# Patient Record
Sex: Male | Born: 1980 | Race: Black or African American | Hispanic: No | Smoking: Never smoker
Health system: Southern US, Community
[De-identification: ages and names within clinical notes are randomized; demographics above are authoritative.]

## PROBLEM LIST (undated history)

## (undated) DIAGNOSIS — K219 Gastro-esophageal reflux disease without esophagitis: Secondary | ICD-10-CM

## (undated) DIAGNOSIS — F419 Anxiety disorder, unspecified: Secondary | ICD-10-CM

## (undated) DIAGNOSIS — R42 Dizziness and giddiness: Secondary | ICD-10-CM

## (undated) DIAGNOSIS — T7840XA Allergy, unspecified, initial encounter: Secondary | ICD-10-CM

## (undated) HISTORY — DX: Gastro-esophageal reflux disease without esophagitis: K21.9

## (undated) HISTORY — DX: Anxiety disorder, unspecified: F41.9

## (undated) HISTORY — PX: TONSILLECTOMY: SUR1361

## (undated) HISTORY — PX: FRACTURE SURGERY: SHX138

## (undated) HISTORY — PX: EYE SURGERY: SHX253

## (undated) HISTORY — PX: NECK SURGERY: SHX720

## (undated) HISTORY — DX: Allergy, unspecified, initial encounter: T78.40XA

---

## 2009-05-10 ENCOUNTER — Ambulatory Visit (HOSPITAL_COMMUNITY): Admission: EM | Admit: 2009-05-10 | Discharge: 2009-05-10 | Payer: Self-pay | Admitting: Emergency Medicine

## 2009-08-27 ENCOUNTER — Emergency Department (HOSPITAL_COMMUNITY): Admission: EM | Admit: 2009-08-27 | Discharge: 2009-08-27 | Payer: Self-pay | Admitting: Emergency Medicine

## 2010-06-04 LAB — CBC
HCT: 46.3 % (ref 39.0–52.0)
MCHC: 34.3 g/dL (ref 30.0–36.0)
MCV: 84.5 fL (ref 78.0–100.0)
Platelets: 235 10*3/uL (ref 150–400)
RDW: 12.4 % (ref 11.5–15.5)

## 2010-06-04 LAB — SAMPLE TO BLOOD BANK

## 2010-06-04 LAB — DIFFERENTIAL
Basophils Relative: 1 % (ref 0–1)
Eosinophils Absolute: 0 10*3/uL (ref 0.0–0.7)
Eosinophils Relative: 1 % (ref 0–5)
Neutrophils Relative %: 57 % (ref 43–77)

## 2010-08-21 ENCOUNTER — Inpatient Hospital Stay (INDEPENDENT_AMBULATORY_CARE_PROVIDER_SITE_OTHER)
Admission: RE | Admit: 2010-08-21 | Discharge: 2010-08-21 | Disposition: A | Discharge status: Home / Self Care | Payer: BC Managed Care – PPO | Source: Ambulatory Visit | Attending: Family Medicine | Admitting: Family Medicine

## 2010-08-21 DIAGNOSIS — H81399 Other peripheral vertigo, unspecified ear: Secondary | ICD-10-CM

## 2011-02-15 ENCOUNTER — Encounter: Payer: Self-pay | Admitting: *Deleted

## 2011-02-15 ENCOUNTER — Emergency Department (INDEPENDENT_AMBULATORY_CARE_PROVIDER_SITE_OTHER)
Admission: EM | Admit: 2011-02-15 | Discharge: 2011-02-15 | Disposition: A | Discharge status: Home / Self Care | Payer: BC Managed Care – PPO | Source: Home / Self Care | Attending: Family Medicine | Admitting: Family Medicine

## 2011-02-15 DIAGNOSIS — R6889 Other general symptoms and signs: Secondary | ICD-10-CM

## 2011-02-15 MED ORDER — IBUPROFEN 800 MG PO TABS
800.0000 mg | ORAL_TABLET | Freq: Once | ORAL | Status: AC
  Administered 2011-02-15: 800 mg via ORAL

## 2011-02-15 MED ORDER — IBUPROFEN 800 MG PO TABS
ORAL_TABLET | ORAL | Status: AC
  Filled 2011-02-15: qty 1

## 2011-02-15 MED ORDER — FEXOFENADINE-PSEUDOEPHED ER 60-120 MG PO TB12: 1.0000 | ORAL_TABLET | Freq: Two times a day (BID) | ORAL | Status: DC

## 2011-02-15 MED ORDER — IBUPROFEN 800 MG PO TABS: 800.0000 mg | ORAL_TABLET | Freq: Three times a day (TID) | ORAL | Status: AC

## 2011-02-15 MED ORDER — HYDROCODONE-ACETAMINOPHEN 7.5-325 MG/15ML PO SOLN: 15.0000 mL | Freq: Three times a day (TID) | ORAL | Status: AC | PRN

## 2011-02-15 NOTE — ED Provider Notes (Signed)
History     CSN: [id number] Arrival date & time: 02/15/2011  3:49 PM   First MD Initiated Contact with Patient 02/15/11 1614      Chief Complaint  Patient presents with  . Cough  . Chills  . Generalized Body Aches    (Consider location/radiation/quality/duration/timing/severity/associated sxs/prior treatment) HPI Comments: Previusly healthy 30 y/o male here c/o generalized body aches, headache, cough with yellow sputum, nasal congestion for 2 days associated with hight fever up to 102. No nausea vomiting or diarrhea. No abdominal pain. No chest pain or difficulty breathing. Non smoker. Taking over the counter cold remedies inconsistently. Decreased appetite but drinking fluids well.  Patient is a 30 y.o. male presenting with cough.  Cough Associated symptoms include chills, rhinorrhea, sore throat and myalgias. Pertinent negatives include no chest pain, no ear pain, no headaches, no shortness of breath and no wheezing.    History reviewed. No pertinent past medical history.  History reviewed. No pertinent past surgical history.  History reviewed. No pertinent family history.  History  Substance Use Topics  . Smoking status: Never Smoker   . Smokeless tobacco: Not on file  . Alcohol Use: No      Review of Systems  Constitutional: Positive for fever, chills and appetite change.  HENT: Positive for congestion, sore throat and rhinorrhea. Negative for ear pain and trouble swallowing.   Eyes: Negative for discharge.  Respiratory: Positive for cough. Negative for chest tightness, shortness of breath and wheezing.   Cardiovascular: Negative for chest pain.  Gastrointestinal: Negative for abdominal pain.  Musculoskeletal: Positive for myalgias and arthralgias.  Skin: Negative for rash.  Neurological: Negative for headaches.    Allergies  Review of patient's allergies indicates no known allergies.  Home Medications   Current Outpatient Rx  Name Route Sig Dispense Refill   . FEXOFENADINE-PSEUDOEPHED ER 60-120 MG PO TB12 Oral Take 1 tablet by mouth every 12 (twelve) hours. 30 tablet 0  . HYDROCODONE-ACETAMINOPHEN 7.5-325 MG/15ML PO SOLN Oral Take 15 mLs by mouth every 8 (eight) hours as needed for pain (or cough). 120 mL 0  . IBUPROFEN 800 MG PO TABS Oral Take 1 tablet (800 mg total) by mouth 3 (three) times daily. 21 tablet 0    BP 126/75  Pulse 86  Temp(Src) 102.8 F (39.3 C) (Oral)  Resp 18  SpO2 100%  Physical Exam  Nursing note and vitals reviewed. Constitutional: He is oriented to person, place, and time. He appears well-developed and well-nourished. No distress.  HENT:  Head: Normocephalic.       Nasal Congestion with erythema and swelling of nasal turbinates, clear rhinorrhea. Significant pharyngeal erythema no exudates. No uvula deviation. No trismus. TM's with increased vascular markings and some dullness bilaterally no swelling or bulging   Eyes:       Bilateral conjunctival injection, no blepharitis or discharge.   Neck: Normal range of motion. Neck supple.  Cardiovascular: Normal rate, regular rhythm and normal heart sounds.   No murmur heard. Pulmonary/Chest: Effort normal and breath sounds normal. No respiratory distress. He has no wheezes. He has no rales. He exhibits no tenderness.  Abdominal: Soft. There is no tenderness.  Lymphadenopathy:    He has no cervical adenopathy.  Neurological: He is alert and oriented to person, place, and time.  Skin: Skin is warm. No rash noted.    ED Course  Procedures (including critical care time)  Labs Reviewed - No data to display No results found.   1. Influenza-like illness  MDM          Sharin Grave, MD 02/15/11 [phone]

## 2011-02-15 NOTE — ED Notes (Signed)
Pt is here with complaints of cough with yellow sputum, generalized body aches, chills and sore throat since Wednesday.

## 2011-02-16 NOTE — ED Notes (Signed)
[facility] called re: hydrocodone 7.5/325 prerscription only have 7.5/500  - per dr Ladon Applebaum may substitute  - pt with no history liver disease - verified with pt by pharmacy personnel

## 2012-02-14 ENCOUNTER — Encounter (HOSPITAL_COMMUNITY): Payer: Self-pay | Admitting: *Deleted

## 2012-02-14 ENCOUNTER — Emergency Department (HOSPITAL_COMMUNITY)
Admission: EM | Admit: 2012-02-14 | Discharge: 2012-02-14 | Discharge status: Against Medical Advice | Payer: BC Managed Care – PPO | Attending: Emergency Medicine | Admitting: Emergency Medicine

## 2012-02-14 ENCOUNTER — Emergency Department (HOSPITAL_COMMUNITY): Payer: BC Managed Care – PPO

## 2012-02-14 DIAGNOSIS — R079 Chest pain, unspecified: Secondary | ICD-10-CM | POA: Insufficient documentation

## 2012-02-14 HISTORY — DX: Dizziness and giddiness: R42

## 2012-02-14 LAB — BASIC METABOLIC PANEL
BUN: 16 mg/dL (ref 6–23)
Calcium: 10 mg/dL (ref 8.4–10.5)
GFR calc Af Amer: >90 mL/min (ref >90)
GFR calc non Af Amer: >90 mL/min (ref >90)
Glucose, Bld: 91 mg/dL (ref 70–99)
Potassium: 3.8 mEq/L (ref 3.5–5.1)
Sodium: 136 mEq/L (ref 135–145)

## 2012-02-14 LAB — CBC
Hemoglobin: 15.6 g/dL (ref 13.0–17.0)
MCH: 28.7 pg (ref 26.0–34.0)
MCHC: 34.5 g/dL (ref 30.0–36.0)
RDW: 12.7 % (ref 11.5–15.5)

## 2012-02-14 LAB — POCT I-STAT TROPONIN I

## 2012-02-14 NOTE — ED Notes (Signed)
Patient reports that he does not want to wait any longer to see a doctor; patient encouraged to stay and informed patient that all results are back and that the MD would be able to see results as soon as he gets a room.  Patient here with mother and father; patient states that he wants to follow up at Urgent Care in the morning rather than wait.  Asked patient if he would like to speak to charge nurse or AD; patient refused.  Patient stable at this time; no respiratory or acute distress.  Patient informed of risks of leaving AMA, including possible death.  Patient verbalized understanding.

## 2012-02-14 NOTE — ED Notes (Signed)
Pt states began experiencing left sided CP at work this morning, took 325 ASA at that time.  Also c/o left arm tingling.  Denis n/v, SOB, diaphoresis

## 2012-02-15 ENCOUNTER — Emergency Department (HOSPITAL_COMMUNITY)
Admission: EM | Admit: 2012-02-15 | Discharge: 2012-02-15 | Disposition: A | Discharge status: Home / Self Care | Payer: BC Managed Care – PPO | Attending: Emergency Medicine | Admitting: Emergency Medicine

## 2012-02-15 ENCOUNTER — Encounter (HOSPITAL_COMMUNITY): Payer: Self-pay | Admitting: *Deleted

## 2012-02-15 DIAGNOSIS — R0789 Other chest pain: Secondary | ICD-10-CM

## 2012-02-15 DIAGNOSIS — Z7982 Long term (current) use of aspirin: Secondary | ICD-10-CM | POA: Insufficient documentation

## 2012-02-15 LAB — BASIC METABOLIC PANEL
BUN: 13 mg/dL (ref 6–23)
CO2: 22 mEq/L (ref 19–32)
Calcium: 9.5 mg/dL (ref 8.4–10.5)
Creatinine, Ser: 1.01 mg/dL (ref 0.50–1.35)
GFR calc non Af Amer: >90 mL/min (ref >90)
Glucose, Bld: 87 mg/dL (ref 70–99)

## 2012-02-15 LAB — CBC
HCT: 46.1 % (ref 39.0–52.0)
MCHC: 34.5 g/dL (ref 30.0–36.0)
MCV: 82.8 fL (ref 78.0–100.0)
Platelets: 191 10*3/uL (ref 150–400)
RDW: 12.5 % (ref 11.5–15.5)
WBC: 4.1 10*3/uL (ref 4.0–10.5)

## 2012-02-15 NOTE — ED Provider Notes (Signed)
History     CSN: [id number]  Arrival date & time 02/15/12  1205   First MD Initiated Contact with Patient 02/15/12 1301      Chief Complaint  Patient presents with  . Chest Pain    (Consider location/radiation/quality/duration/timing/severity/associated sxs/prior treatment) Patient is a 31 y.o. male presenting with chest pain. The history is provided by the patient and a parent. No language interpreter was used.  Chest Pain The chest pain began more than 1 year ago. Chest pain occurs intermittently. The chest pain is worsening. At its most intense, the pain is at 8/10. The pain is currently at 0/10. The quality of the pain is described as sharp. The pain does not radiate. Pertinent negatives for primary symptoms include no fever, no syncope, no shortness of breath, no cough, no wheezing, no palpitations, no abdominal pain, no nausea, no vomiting and no dizziness. He tried nothing for the symptoms.  Procedure history is negative for cardiac catheterization, echocardiogram and stress echo.    31 year old male coming in with complaint of chest pain intermittently since high school. States that most recently the pain was yesterday around 9 AM is 8/10 sharp under his left breast it lasted several minutes. States that the pain returned over 12 times yesterday and last 10-12 minutes each time. Patient had no nausea shortness of breath and dizziness with the pain. Patient does not smoke he does not have high cholesterol he has no family history of high cholesterol or heart disease.does not smoke.   Patient came to the ER last night but after a six-hour wait decided to leave. He had one negative troponin last night. EKG is unchanged from last night.  States he has had no pain today. Patient exercises every day.  Past Medical History  Diagnosis Date  . Vertigo     Past Surgical History  Procedure Date  . Neck surgery   . Eye surgery     History reviewed. No pertinent family history.  History   Substance Use Topics  . Smoking status: Never Smoker   . Smokeless tobacco: Not on file  . Alcohol Use: No      Review of Systems  Constitutional: Negative.  Negative for fever.  HENT: Negative.   Eyes: Negative.   Respiratory: Negative.  Negative for cough, shortness of breath and wheezing.   Cardiovascular: Positive for chest pain. Negative for palpitations and syncope.  Gastrointestinal: Negative.  Negative for nausea, vomiting and abdominal pain.  Neurological: Negative.  Negative for dizziness.  Psychiatric/Behavioral: Negative.   All other systems reviewed and are negative.    Allergies  Review of patient's allergies indicates no known allergies.  Home Medications   Current Outpatient Rx  Name  Route  Sig  Dispense  Refill  . ASPIRIN 325 MG PO TABS   Oral   Take 325 mg by mouth every 4 (four) hours as needed. For pain         . ADULT MULTIVITAMIN W/MINERALS CH   Oral   Take 1 tablet by mouth daily.           BP 123/81  Pulse 52  Temp 97.9 F (36.6 C) (Oral)  Resp 18  Ht 5\' 10"  (1.778 m)  Wt 176 lb (79.833 kg)  BMI 25.25 kg/m2  SpO2 100%  Physical Exam  Nursing note and vitals reviewed. Constitutional: He is oriented to person, place, and time. He appears well-developed and well-nourished.  HENT:  Head: Normocephalic.  Eyes: Conjunctivae normal and EOM  are normal. Pupils are equal, round, and reactive to light.  Neck: Normal range of motion. Neck supple.  Cardiovascular: Normal rate, regular rhythm, normal heart sounds and intact distal pulses.   Pulmonary/Chest: Effort normal and breath sounds normal. No respiratory distress.  Abdominal: Soft. Bowel sounds are normal. He exhibits no distension.  Musculoskeletal: Normal range of motion.  Neurological: He is alert and oriented to person, place, and time.  Skin: Skin is warm and dry.  Psychiatric: He has a normal mood and affect.    ED Course  Procedures (including critical care  time)   Labs Reviewed  CBC  POCT I-STAT TROPONIN I  BASIC METABOLIC PANEL  POCT I-STAT TROPONIN I   Dg Chest 2 View  02/14/2012  *RADIOLOGY REPORT*  Clinical Data: Chest pain.  Night sweats.  CHEST - 2 VIEW  Comparison: None.  Findings: Normal sized heart.  Clear lungs.  Minimal scoliosis.  IMPRESSION: No acute abnormality.   Original Report Authenticated By: Beckie Salts, M.D.      1. Chest pain, atypical       MDM  31 yo male with no cardiac risk factors here with c/o L chest pain under L breast/ sharp since high school but more frequent yesterday.  Trop - x 3 .  Ekg unremarkable.  Chest x-ray normal reviewed by myself.  Labs unremarkable.  Will follow up with cardiology this week.  Understands to return to ER for worsening symptoms.   Date: 02/16/2012  Rate: 67  Rhythm: normal sinus rhythm  QRS Axis: normal  Intervals: normal  ST/T Wave abnormalities: early repolarization  Conduction Disutrbances:none  Narrative Interpretation:   Old EKG Reviewed: unchanged  Labs Reviewed  CBC  POCT I-STAT TROPONIN I  BASIC METABOLIC PANEL  POCT I-STAT TROPONIN I  LAB REPORT - SCANNED            Remi Haggard, NP 02/16/12 1134

## 2012-02-15 NOTE — ED Notes (Signed)
Reports having left side chest pain, describes as aching pain, mild ache to left forearm as well. Pt was here last night but left ama due to long wait. ekg done at triage. No resp distress noted at this time.

## 2012-02-18 NOTE — ED Provider Notes (Signed)
Medical screening examination/treatment/procedure(s) were performed by non-physician practitioner and as supervising physician I was immediately available for consultation/collaboration.   Carleene Cooper III, MD 02/18/12 2127

## 2012-02-29 ENCOUNTER — Emergency Department (INDEPENDENT_AMBULATORY_CARE_PROVIDER_SITE_OTHER)
Admission: EM | Admit: 2012-02-29 | Discharge: 2012-02-29 | Disposition: A | Discharge status: Other Acute Inpatient Hospital | Payer: BC Managed Care – PPO | Source: Home / Self Care | Attending: Family Medicine | Admitting: Family Medicine

## 2012-02-29 ENCOUNTER — Emergency Department (HOSPITAL_COMMUNITY)
Admission: EM | Admit: 2012-02-29 | Discharge: 2012-02-29 | Discharge status: Against Medical Advice | Payer: BC Managed Care – PPO | Attending: Emergency Medicine | Admitting: Emergency Medicine

## 2012-02-29 ENCOUNTER — Encounter (HOSPITAL_COMMUNITY): Payer: Self-pay

## 2012-02-29 ENCOUNTER — Encounter (HOSPITAL_COMMUNITY): Payer: Self-pay | Admitting: Family Medicine

## 2012-02-29 ENCOUNTER — Emergency Department (HOSPITAL_COMMUNITY): Payer: BC Managed Care – PPO

## 2012-02-29 DIAGNOSIS — R0789 Other chest pain: Secondary | ICD-10-CM | POA: Insufficient documentation

## 2012-02-29 DIAGNOSIS — R0602 Shortness of breath: Secondary | ICD-10-CM | POA: Insufficient documentation

## 2012-02-29 LAB — COMPREHENSIVE METABOLIC PANEL
AST: 25 U/L (ref 0–37)
BUN: 12 mg/dL (ref 6–23)
CO2: 24 mEq/L (ref 19–32)
Calcium: 10.2 mg/dL (ref 8.4–10.5)
Creatinine, Ser: 1.13 mg/dL (ref 0.50–1.35)
GFR calc Af Amer: >90 mL/min (ref >90)
GFR calc non Af Amer: 84 mL/min — ABNORMAL LOW (ref >90)

## 2012-02-29 LAB — CBC WITH DIFFERENTIAL/PLATELET
Basophils Absolute: 0.1 10*3/uL (ref 0.0–0.1)
Basophils Relative: 2 % — ABNORMAL HIGH (ref 0–1)
Eosinophils Relative: 1 % (ref 0–5)
HCT: 47.2 % (ref 39.0–52.0)
Lymphocytes Relative: 55 % — ABNORMAL HIGH (ref 12–46)
MCHC: 34.7 g/dL (ref 30.0–36.0)
MCV: 83.4 fL (ref 78.0–100.0)
Monocytes Absolute: 0.3 10*3/uL (ref 0.1–1.0)
RDW: 12.9 % (ref 11.5–15.5)

## 2012-02-29 LAB — POCT I-STAT TROPONIN I: Troponin i, poc: 0 ng/mL (ref 0.00–0.08)

## 2012-02-29 LAB — D-DIMER, QUANTITATIVE: D-Dimer, Quant: <0.27 ug/mL-FEU (ref 0.00–0.48)

## 2012-02-29 NOTE — ED Notes (Signed)
reports left sided cp for past few days; saw NP in ED, and was released; states pain about same, but having trouble feeling as if he could not get a good breath this AM

## 2012-02-29 NOTE — ED Notes (Signed)
Pt sent here from [facility] to R/O PE. Pt chest pressure and SOB that started this am. sts feels better now than this am.

## 2012-02-29 NOTE — ED Notes (Signed)
Pt states that he can't wait any longer and needs to leave.

## 2012-02-29 NOTE — ED Provider Notes (Signed)
History     CSN: [id number]  Arrival date & time 02/29/12  1140   First MD Initiated Contact with Patient 02/29/12 1200      Chief Complaint  Patient presents with  . Chest Pain    (Consider location/radiation/quality/duration/timing/severity/associated sxs/prior treatment) Patient is a 31 y.o. male presenting with chest pain. The history is provided by the patient.  Chest Pain The chest pain began more than 1 year ago (seen recently in ER with full w/u , sx since high school   sx of tightness and sob are different from prev, have totally resolved at present.). The chest pain is improving. The pain is associated with breathing. The pain is currently at 0/10. The severity of the pain is mild. The quality of the pain is described as tightness. Pertinent negatives for primary symptoms include no cough, no wheezing, no palpitations, no abdominal pain, no nausea and no vomiting.  Pertinent negatives for associated symptoms include no lower extremity edema.     Past Medical History  Diagnosis Date  . Vertigo     Past Surgical History  Procedure Date  . Neck surgery   . Eye surgery     History reviewed. No pertinent family history.  History  Substance Use Topics  . Smoking status: Never Smoker   . Smokeless tobacco: Not on file  . Alcohol Use: No      Review of Systems  Constitutional: Negative.   Respiratory: Positive for chest tightness. Negative for cough and wheezing.   Cardiovascular: Positive for chest pain. Negative for palpitations.  Gastrointestinal: Negative.  Negative for nausea, vomiting and abdominal pain.    Allergies  Review of patient's allergies indicates no known allergies.  Home Medications   Current Outpatient Rx  Name  Route  Sig  Dispense  Refill  . ASPIRIN 325 MG PO TABS   Oral   Take 325 mg by mouth every 4 (four) hours as needed. For pain         . ADULT MULTIVITAMIN W/MINERALS CH   Oral   Take 1 tablet by mouth daily.            BP 137/88  Pulse 70  Temp 99.1 F (37.3 C) (Oral)  Resp 18  SpO2 100%  Physical Exam  Nursing note and vitals reviewed. Constitutional: He is oriented to person, place, and time. He appears well-developed and well-nourished.  HENT:  Head: Normocephalic.  Mouth/Throat: Oropharynx is clear and moist.  Eyes: Pupils are equal, round, and reactive to light.  Neck: Normal range of motion. Neck supple.  Pulmonary/Chest: Effort normal and breath sounds normal.  Abdominal: Soft. Bowel sounds are normal.  Neurological: He is alert and oriented to person, place, and time.  Skin: Skin is warm and dry.    ED Course  Procedures (including critical care time)  Labs Reviewed - No data to display No results found.   1. Chest pain, atypical       MDM  ecg--early repol otherwise unchanged from prev,         Linna Hoff, MD 02/29/12 1239

## 2012-12-19 ENCOUNTER — Ambulatory Visit (INDEPENDENT_AMBULATORY_CARE_PROVIDER_SITE_OTHER): Payer: BC Managed Care – PPO | Admitting: Family Medicine

## 2012-12-19 VITALS — BP 120/78 | HR 48 | Temp 98.8°F | Resp 18 | Ht 69.5 in | Wt 182.6 lb

## 2012-12-19 DIAGNOSIS — D171 Benign lipomatous neoplasm of skin and subcutaneous tissue of trunk: Secondary | ICD-10-CM

## 2012-12-19 DIAGNOSIS — R109 Unspecified abdominal pain: Secondary | ICD-10-CM

## 2012-12-19 DIAGNOSIS — K5900 Constipation, unspecified: Secondary | ICD-10-CM

## 2012-12-19 NOTE — Patient Instructions (Addendum)
Your symptoms appear to be due to constipation.  Can start fleet enema once or twice today, then miralax 1 cap twice per day until soft stool, then once per day.  If diarrhea or watery stool, stop Miralax, but increase fluids and fiber in diet. See below. If any fever, nausea, vomiting, change in stool color, increase in abdominal pain, or not improving in the next few days - return here or emergency room if needed for recheck.  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    Constipation, Adult Constipation is when a person has fewer than 3 bowel movements a week; has difficulty having a bowel movement; or has stools that are dry, hard, or larger than normal. As people grow older, constipation is more common. If you try to fix constipation with medicines that make you have a bowel movement (laxatives), the problem may get worse. Long-term laxative use may cause the muscles of the colon to become weak. A low-fiber diet, not taking in enough fluids, and taking certain medicines may make constipation worse. CAUSES   Certain medicines, such as antidepressants, pain medicine, iron supplements, antacids, and water pills.   Certain diseases, such as diabetes, irritable bowel syndrome (IBS), thyroid disease, or depression.   Not drinking enough water.   Not eating enough fiber-rich foods.   Stress or travel.  Lack of physical activity or exercise.  Not going to the restroom when there is the urge to have a bowel movement.  Ignoring the urge to have a bowel movement.  Using laxatives too much. SYMPTOMS   Having fewer than 3 bowel movements a week.   Straining to have a bowel movement.   Having hard, dry, or larger than normal stools.   Feeling full or bloated.   Pain in the lower abdomen.  Not feeling relief after having a bowel movement. DIAGNOSIS  Your caregiver will take a medical history and perform  a physical exam. Further testing may be done for severe constipation. Some tests may include:   A barium enema X-ray to examine your rectum, colon, and sometimes, your small intestine.  A sigmoidoscopy to examine your lower colon.  A colonoscopy to examine your entire colon. TREATMENT  Treatment will depend on the severity of your constipation and what is causing it. Some dietary treatments include drinking more fluids and eating more fiber-rich foods. Lifestyle treatments may include regular exercise. If these diet and lifestyle recommendations do not help, your caregiver may recommend taking over-the-counter laxative medicines to help you have bowel movements. Prescription medicines may be prescribed if over-the-counter medicines do not work.  HOME CARE INSTRUCTIONS   Increase dietary fiber in your diet, such as fruits, vegetables, whole grains, and beans. Limit high-fat and processed sugars in your diet, such as [place] fries, hamburgers, cookies, candies, and soda.   A fiber supplement may be added to your diet if you cannot get enough fiber from foods.   Drink enough fluids to keep your urine clear or pale yellow.   Exercise regularly or as directed by your caregiver.   Go to the restroom when you have the urge to go. Do not hold it.  Only take medicines as directed by your caregiver. Do not take other medicines for constipation without talking to your caregiver first. SEEK IMMEDIATE MEDICAL CARE IF:   You have bright red blood in your stool.   Your constipation lasts for  more than 4 days or gets worse.   You have abdominal or rectal pain.   You have thin, pencil-like stools.  You have unexplained weight loss. MAKE SURE YOU:   Understand these instructions.  Will watch your condition.  Will get help right away if you are not doing well or get worse. Document Released: 11/24/2003 Document Revised: 05/20/2011 Document Reviewed: 01/29/2011 [facility] Patient  Information 2014 [place], [place].   Lipoma A lipoma is a noncancerous (benign) tumor composed of fat cells. They are usually found under the skin (subcutaneous). A lipoma may occur in any tissue of the body that contains fat. Common areas for lipomas to appear include the back, shoulders, buttocks, and thighs. Lipomas are a very common soft tissue growth. They are soft and grow slowly. Most problems caused by a lipoma depend on where it is growing. DIAGNOSIS  A lipoma can be diagnosed with a physical exam. These tumors rarely become cancerous, but radiographic studies can help determine this for certain. Studies used may include:  Computerized X-ray scans (CT or CAT scan).  Computerized magnetic scans (MRI). TREATMENT  Small lipomas that are not causing problems may be watched. If a lipoma continues to enlarge or causes problems, removal is often the best treatment. Lipomas can also be removed to improve appearance. Surgery is done to remove the fatty cells and the surrounding capsule. Most often, this is done with medicine that numbs the area (local anesthetic). The removed tissue is examined under a microscope to make sure it is not cancerous. Keep all follow-up appointments with your caregiver. SEEK MEDICAL CARE IF:   The lipoma becomes larger or hard.  The lipoma becomes painful, red, or increasingly swollen. These could be signs of infection or a more serious condition. Document Released: 02/15/2002 Document Revised: 05/20/2011 Document Reviewed: 07/28/2009 [facility] Patient Information 2014 [place], [place].

## 2012-12-19 NOTE — Progress Notes (Signed)
Subjective:    Patient ID: Jeremy [name], male    DOB: 1980/07/29, 32 y.o.   MRN: [id number]  HPI Jeremy [name] is a 32 y.o. male  L sided flank pain/abd pain for few weeks.    Seen by Dr. Roseanne Reno for similar sx's earlier this year - constipated then - given Rx for stool softener - lactulose? Helped some.  Has been constipated recently again. Last BM 3 days ago, hard stool.  Constipated past 2-3 weeks as well, tried lactulose 2 tablespoons every other day - restarted last week, no relief. Last dose of lactulose 2 days ago. BM usually 1-2 times per day when well.   Pain on L side after eating, no fever/n/v/diarrhea.   No hx of kidney stones. No known hx of PUD, no dark/tarry stools.  Etoh: 4 drinks per month, no recent increase.   Review of Systems  Constitutional: Negative for fever and chills.  Respiratory: Negative for shortness of breath.   Cardiovascular: Negative for chest pain.  Gastrointestinal: Positive for abdominal pain. Negative for abdominal distention (bloated at times. ).  Genitourinary: Negative for dysuria, urgency, frequency, hematuria and difficulty urinating.      Objective:   Physical Exam  Vitals reviewed. Constitutional: He is oriented to person, place, and time. He appears well-developed and well-nourished.  HENT:  Head: Normocephalic and atraumatic.  Eyes: EOM are normal. Pupils are equal, round, and reactive to light.  Neck: No JVD present. Carotid bruit is not present.  Cardiovascular: Normal rate, regular rhythm and normal heart sounds.   No murmur heard. Pulmonary/Chest: Effort normal and breath sounds normal. He has no rales.  Abdominal: Soft. Bowel sounds are normal. He exhibits no distension. There is no tenderness. There is no rebound and no guarding.  Musculoskeletal: He exhibits no edema.  Neurological: He is alert and oriented to person, place, and time.  Skin: Skin is warm and dry.     Psychiatric: He has a normal mood and affect.        Assessment & Plan:  Jeremy [name] is a 32 y.o. male Constipation  Abdominal pain  Lipoma of back   2-3 week hx of abdominal pain L sided.  Suspected constipation. Discussed option of treating this first and recheck in next few days or labs and xrays today. Will try fleet enema, and miralax with recheck if not improving next few days.  Sooner if worse. Constipation prevention discussed.    Lipoma - discussed benign appearance, but RTC precautions.    Patient Instructions  Your symptoms appear to be due to constipation.  Can start fleet enema once or twice today, then miralax 1 cap twice per day until soft stool, then once per day.  If diarrhea or watery stool, stop Miralax, but increase fluids and fiber in diet. See below. If any fever, nausea, vomiting, change in stool color, increase in abdominal pain, or not improving in the next few days - return here or emergency room if needed for recheck.  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    Constipation, Adult Constipation is when a person has fewer than 3 bowel movements a week; has difficulty having a bowel movement; or has stools that are dry, hard, or larger than normal. As people grow older, constipation is more common. If you try to fix constipation with medicines that make you have a bowel movement (laxatives), the problem may get  worse. Long-term laxative use may cause the muscles of the colon to become weak. A low-fiber diet, not taking in enough fluids, and taking certain medicines may make constipation worse. CAUSES   Certain medicines, such as antidepressants, pain medicine, iron supplements, antacids, and water pills.   Certain diseases, such as diabetes, irritable bowel syndrome (IBS), thyroid disease, or depression.   Not drinking enough water.   Not eating enough fiber-rich foods.   Stress or travel.  Lack of physical activity or  exercise.  Not going to the restroom when there is the urge to have a bowel movement.  Ignoring the urge to have a bowel movement.  Using laxatives too much. SYMPTOMS   Having fewer than 3 bowel movements a week.   Straining to have a bowel movement.   Having hard, dry, or larger than normal stools.   Feeling full or bloated.   Pain in the lower abdomen.  Not feeling relief after having a bowel movement. DIAGNOSIS  Your caregiver will take a medical history and perform a physical exam. Further testing may be done for severe constipation. Some tests may include:   A barium enema X-ray to examine your rectum, colon, and sometimes, your small intestine.  A sigmoidoscopy to examine your lower colon.  A colonoscopy to examine your entire colon. TREATMENT  Treatment will depend on the severity of your constipation and what is causing it. Some dietary treatments include drinking more fluids and eating more fiber-rich foods. Lifestyle treatments may include regular exercise. If these diet and lifestyle recommendations do not help, your caregiver may recommend taking over-the-counter laxative medicines to help you have bowel movements. Prescription medicines may be prescribed if over-the-counter medicines do not work.  HOME CARE INSTRUCTIONS   Increase dietary fiber in your diet, such as fruits, vegetables, whole grains, and beans. Limit high-fat and processed sugars in your diet, such as [place] fries, hamburgers, cookies, candies, and soda.   A fiber supplement may be added to your diet if you cannot get enough fiber from foods.   Drink enough fluids to keep your urine clear or pale yellow.   Exercise regularly or as directed by your caregiver.   Go to the restroom when you have the urge to go. Do not hold it.  Only take medicines as directed by your caregiver. Do not take other medicines for constipation without talking to your caregiver first. SEEK IMMEDIATE MEDICAL CARE  IF:   You have bright red blood in your stool.   Your constipation lasts for more than 4 days or gets worse.   You have abdominal or rectal pain.   You have thin, pencil-like stools.  You have unexplained weight loss. MAKE SURE YOU:   Understand these instructions.  Will watch your condition.  Will get help right away if you are not doing well or get worse. Document Released: 11/24/2003 Document Revised: 05/20/2011 Document Reviewed: 01/29/2011 [facility] Patient Information 2014 [place], [place].   Lipoma A lipoma is a noncancerous (benign) tumor composed of fat cells. They are usually found under the skin (subcutaneous). A lipoma may occur in any tissue of the body that contains fat. Common areas for lipomas to appear include the back, shoulders, buttocks, and thighs. Lipomas are a very common soft tissue growth. They are soft and grow slowly. Most problems caused by a lipoma depend on where it is growing. DIAGNOSIS  A lipoma can be diagnosed with a physical exam. These tumors rarely become cancerous, but radiographic studies can help  determine this for certain. Studies used may include:  Computerized X-ray scans (CT or CAT scan).  Computerized magnetic scans (MRI). TREATMENT  Small lipomas that are not causing problems may be watched. If a lipoma continues to enlarge or causes problems, removal is often the best treatment. Lipomas can also be removed to improve appearance. Surgery is done to remove the fatty cells and the surrounding capsule. Most often, this is done with medicine that numbs the area (local anesthetic). The removed tissue is examined under a microscope to make sure it is not cancerous. Keep all follow-up appointments with your caregiver. SEEK MEDICAL CARE IF:   The lipoma becomes larger or hard.  The lipoma becomes painful, red, or increasingly swollen. These could be signs of infection or a more serious condition. Document Released: 02/15/2002 Document  Revised: 05/20/2011 Document Reviewed: 07/28/2009 [facility] Patient Information 2014 [place], [place].

## 2013-06-01 ENCOUNTER — Encounter: Payer: Self-pay | Admitting: Gastroenterology

## 2013-07-08 ENCOUNTER — Ambulatory Visit (INDEPENDENT_AMBULATORY_CARE_PROVIDER_SITE_OTHER): Payer: BC Managed Care – PPO | Admitting: Gastroenterology

## 2013-07-08 ENCOUNTER — Encounter: Payer: Self-pay | Admitting: Gastroenterology

## 2013-07-08 VITALS — BP 124/68 | HR 68 | Ht 69.0 in | Wt 185.0 lb

## 2013-07-08 DIAGNOSIS — K5900 Constipation, unspecified: Secondary | ICD-10-CM

## 2013-07-08 DIAGNOSIS — K219 Gastro-esophageal reflux disease without esophagitis: Secondary | ICD-10-CM

## 2013-07-08 MED ORDER — OMEPRAZOLE 20 MG PO CPDR: 20.0000 mg | DELAYED_RELEASE_CAPSULE | Freq: Every day | ORAL | Status: DC

## 2013-07-08 NOTE — Progress Notes (Signed)
    History of Present Illness: This is a 33 year old male who complains of constipation, abdominal pain and frequent belching. He was seen by his PCP and at [facility]. He substantially changed his diet avoiding high fat and fried foods and eating more fruits and vegetables. He drinks a substantial amount of water. His constipation and abdominal pain completely resolved. He has had improvement in his frequent belching but he still notes this problem on a daily basis. Denies weight loss, diarrhea, change in stool caliber, melena, hematochezia, nausea, vomiting, dysphagia, chest pain.  Review of Systems: Pertinent positive and negative review of systems were noted in the above HPI section. All other review of systems were otherwise negative.  Current Medications, Allergies, Past Medical History, Past Surgical History, Family History and Social History were reviewed in [employer] record.  Physical Exam: General: Well developed , well nourished, no acute distress Head: Normocephalic and atraumatic Eyes:  sclerae anicteric, EOMI Ears: Normal auditory acuity Mouth: No deformity or lesions Neck: Supple, no masses or thyromegaly Lungs: Clear throughout to auscultation Heart: Regular rate and rhythm; no murmurs, rubs or bruits Abdomen: Soft, non tender and non distended. No masses, hepatosplenomegaly or hernias noted. Normal Bowel sounds Musculoskeletal: Symmetrical with no gross deformities  Skin: No lesions on visible extremities Pulses:  Normal pulses noted Extremities: No clubbing, cyanosis, edema or deformities noted Neurological: Alert oriented x 4, grossly nonfocal Cervical Nodes:  No significant cervical adenopathy Inguinal Nodes: No significant inguinal adenopathy Psychological:  Alert and cooperative. Normal mood and affect  Assessment and Recommendations:  1. Constipation and abdominal pain. Continue high fiber healthy diet adequate daily water intake. REV  in 6 weeks.  2. Frequent belching. Presumed GERD. Begin standard antireflux measures and omeprazole 20 mg daily. REV in 6 weeks.

## 2013-07-08 NOTE — Patient Instructions (Signed)
We have sent the following medications to your pharmacy for you to pick up at your convenience: Omeprazole.  Patient advised to avoid spicy, acidic, citrus, chocolate, mints, fruit and fruit juices.  Limit the intake of caffeine, alcohol and Soda.  Don't exercise too soon after eating.  Don't lie down within 3-4 hours of eating.  Elevate the head of your bed.  Thank you for choosing me and [facility].  Pricilla Riffle. Dagoberto Ligas., MD., Marval Regal  cc: Ledell Peoples, MD

## 2013-07-26 ENCOUNTER — Telehealth: Payer: Self-pay | Admitting: Gastroenterology

## 2013-07-26 NOTE — Telephone Encounter (Signed)
Patient requesting an appt with Dr. Fuller Plan.  He is having side effects from omeprazole.  He stopped it 2 days ago and symptoms resolved. He is asked to try omeprazole again and if his symptoms return then call us back and we will change his PPI.  He is given a follow up with DR. Fuller Plan for 6/4

## 2013-08-12 ENCOUNTER — Ambulatory Visit (INDEPENDENT_AMBULATORY_CARE_PROVIDER_SITE_OTHER): Payer: BC Managed Care – PPO | Admitting: Gastroenterology

## 2013-08-12 ENCOUNTER — Encounter: Payer: Self-pay | Admitting: Gastroenterology

## 2013-08-12 ENCOUNTER — Other Ambulatory Visit (INDEPENDENT_AMBULATORY_CARE_PROVIDER_SITE_OTHER): Payer: BC Managed Care – PPO

## 2013-08-12 VITALS — BP 110/60 | HR 60 | Ht 69.0 in | Wt 183.0 lb

## 2013-08-12 DIAGNOSIS — R1013 Epigastric pain: Secondary | ICD-10-CM

## 2013-08-12 DIAGNOSIS — R109 Unspecified abdominal pain: Secondary | ICD-10-CM

## 2013-08-12 DIAGNOSIS — R143 Flatulence: Secondary | ICD-10-CM

## 2013-08-12 DIAGNOSIS — K5900 Constipation, unspecified: Secondary | ICD-10-CM

## 2013-08-12 DIAGNOSIS — R141 Gas pain: Secondary | ICD-10-CM

## 2013-08-12 DIAGNOSIS — K3189 Other diseases of stomach and duodenum: Secondary | ICD-10-CM

## 2013-08-12 DIAGNOSIS — R142 Eructation: Secondary | ICD-10-CM

## 2013-08-12 LAB — CBC WITH DIFFERENTIAL/PLATELET
BASOS ABS: 0 10*3/uL (ref 0.0–0.1)
Basophils Relative: 1 % (ref 0.0–3.0)
Eosinophils Absolute: 0.1 10*3/uL (ref 0.0–0.7)
Eosinophils Relative: 3 % (ref 0.0–5.0)
HCT: 41.9 % (ref 39.0–52.0)
Hemoglobin: 14.1 g/dL (ref 13.0–17.0)
LYMPHS ABS: 2.2 10*3/uL (ref 0.7–4.0)
LYMPHS PCT: 47.1 % — AB (ref 12.0–46.0)
MCHC: 33.7 g/dL (ref 30.0–36.0)
MCV: 85.7 fl (ref 78.0–100.0)
MONOS PCT: 8.1 % (ref 3.0–12.0)
Monocytes Absolute: 0.4 10*3/uL (ref 0.1–1.0)
NEUTROS PCT: 40.8 % — AB (ref 43.0–77.0)
Neutro Abs: 1.9 10*3/uL (ref 1.4–7.7)
PLATELETS: 206 10*3/uL (ref 150.0–400.0)
RBC: 4.89 Mil/uL (ref 4.22–5.81)
RDW: 13.3 % (ref 11.5–15.5)
WBC: 4.7 10*3/uL (ref 4.0–10.5)

## 2013-08-12 LAB — BASIC METABOLIC PANEL
BUN: 8 mg/dL (ref 6–23)
CO2: 29 meq/L (ref 19–32)
Calcium: 9.3 mg/dL (ref 8.4–10.5)
Chloride: 105 mEq/L (ref 96–112)
Creatinine, Ser: 1 mg/dL (ref 0.4–1.5)
GFR: 104.83 mL/min (ref >60.00)
GLUCOSE: 80 mg/dL (ref 70–99)
POTASSIUM: 3.5 meq/L (ref 3.5–5.1)
SODIUM: 139 meq/L (ref 135–145)

## 2013-08-12 LAB — HEPATIC FUNCTION PANEL
ALBUMIN: 4.3 g/dL (ref 3.5–5.2)
ALK PHOS: 54 U/L (ref 39–117)
ALT: 32 U/L (ref 0–53)
AST: 34 U/L (ref 0–37)
Bilirubin, Direct: 0 mg/dL (ref 0.0–0.3)
TOTAL PROTEIN: 6.8 g/dL (ref 6.0–8.3)
Total Bilirubin: 0.2 mg/dL (ref 0.2–1.2)

## 2013-08-12 LAB — TSH: TSH: 0.6 u[IU]/mL (ref 0.35–4.50)

## 2013-08-12 MED ORDER — PANTOPRAZOLE SODIUM 40 MG PO TBEC: 40.0000 mg | DELAYED_RELEASE_TABLET | Freq: Every day | ORAL | Status: DC

## 2013-08-12 NOTE — Patient Instructions (Addendum)
Your physician has requested that you go to the basement for the following lab work before leaving today: [employer]  Stop taking your Prilosec.  We have sent the following medications to your pharmacy for you to pick up at your convenience: pantoprazole.   Start over the counter Gas-X four times a day as needed for gas and bloating.  Increase your fluid intake to help with constipation.  High-Fiber Diet Fiber is found in fruits, vegetables, and grains. A high-fiber diet encourages the addition of more whole grains, legumes, fruits, and vegetables in your diet. The recommended amount of fiber for adult males is 38 g per day. For adult females, it is 25 g per day. Pregnant and lactating women should get 28 g of fiber per day. If you have a digestive or bowel problem, ask your caregiver for advice before adding high-fiber foods to your diet. Eat a variety of high-fiber foods instead of only a select few type of foods.  PURPOSE  To increase stool bulk.  To make bowel movements more regular to prevent constipation.  To lower cholesterol.  To prevent overeating. WHEN IS THIS DIET USED?  It may be used if you have constipation and hemorrhoids.  It may be used if you have uncomplicated diverticulosis (intestine condition) and irritable bowel syndrome.  It may be used if you need help with weight management.  It may be used if you want to add it to your diet as a protective measure against atherosclerosis, diabetes, and cancer. SOURCES OF FIBER  Whole-grain breads and cereals.  Fruits, such as apples, oranges, bananas, berries, prunes, and pears.  Vegetables, such as green peas, carrots, sweet potatoes, beets, broccoli, cabbage, spinach, and artichokes.  Legumes, such split peas, soy, lentils.  Almonds. FIBER CONTENT IN FOODS Starches and Grains / Dietary Fiber (g)  Cheerios, 1 cup / 3 g  Corn Flakes cereal, 1 cup / 0.7 g  Rice crispy treat cereal, 1 cup / 0.3  g  Instant oatmeal (cooked),  cup / 2 g  Frosted wheat cereal, 1 cup / 5.1 g  Brown, long-grain rice (cooked), 1 cup / 3.5 g  White, long-grain rice (cooked), 1 cup / 0.6 g  Enriched macaroni (cooked), 1 cup / 2.5 g Legumes / Dietary Fiber (g)  Baked beans (canned, plain, or vegetarian),  cup / 5.2 g  Kidney beans (canned),  cup / 6.8 g  Pinto beans (cooked),  cup / 5.5 g Breads and Crackers / Dietary Fiber (g)  Plain or honey graham crackers, 2 squares / 0.7 g  Saltine crackers, 3 squares / 0.3 g  Plain, salted pretzels, 10 pieces / 1.8 g  Whole-wheat bread, 1 slice / 1.9 g  White bread, 1 slice / 0.7 g  Raisin bread, 1 slice / 1.2 g  Plain bagel, 3 oz / 2 g  Flour tortilla, 1 oz / 0.9 g  Corn tortilla, 1 small / 1.5 g  Hamburger or hotdog bun, 1 small / 0.9 g Fruits / Dietary Fiber (g)  Apple with skin, 1 medium / 4.4 g  Sweetened applesauce,  cup / 1.5 g  Banana,  medium / 1.5 g  Grapes, 10 grapes / 0.4 g  Orange, 1 small / 2.3 g  Raisin, 1.5 oz / 1.6 g  Melon, 1 cup / 1.4 g Vegetables / Dietary Fiber (g)  Green beans (canned),  cup / 1.3 g  Carrots (cooked),  cup / 2.3 g  Broccoli (cooked),  cup /  2.8 g  Peas (cooked),  cup / 4.4 g  Mashed potatoes,  cup / 1.6 g  Lettuce, 1 cup / 0.5 g  Corn (canned),  cup / 1.6 g  Tomato,  cup / 1.1 g Document Released: 02/25/2005 Document Revised: 08/27/2011 Document Reviewed: 05/30/2011 [facility] Patient Information 2014 [place], [place].  Thank you for choosing me and [facility].  Pricilla Riffle. Dagoberto Ligas., MD., Marval Regal  cc: Ledell Peoples, MD

## 2013-08-12 NOTE — Progress Notes (Signed)
    History of Present Illness: This is a 33 year old male who has mild constipation associated with transient left lower quadrant cramping and epigastric discomfort and bloating sensation. His epigastric symptoms are generally exacerbated by meals, especially large meals. He states he had dizziness with omeprazole discontinued. His mild constipation improved substantially with dietary changes but has not resolved.   Current Medications, Allergies, Past Medical History, Past Surgical History, Family History and Social History were reviewed in [employer] record.  Physical Exam: General: Well developed , well nourished, no acute distress Head: Normocephalic and atraumatic Eyes:  sclerae anicteric, EOMI Ears: Normal auditory acuity Mouth: No deformity or lesions Lungs: Clear throughout to auscultation Heart: Regular rate and rhythm; no murmurs, rubs or bruits Abdomen: Soft, non tender and non distended. No masses, hepatosplenomegaly or hernias noted. Normal Bowel sounds Musculoskeletal: Symmetrical with no gross deformities  Pulses:  Normal pulses noted Extremities: No clubbing, cyanosis, edema or deformities noted Neurological: Alert oriented x 4, grossly nonfocal Psychological:  Alert and cooperative. Normal mood and affect  Assessment and Recommendations:  1. Mild constipation but occasional left lower quadrant cramping. Increase dietary fiber and fluid intake. Obtain CBC, CMET, TSH.  2. Dyspepsia. Possible GERD. Pantoprazole 40 mg daily, Gas-X 4 times a day when necessary, antireflux measures, low gas diet. If symptoms have not resolved consider abdominal ultrasound and upper endoscopy for further evaluation. REV in 4 weeks.

## 2013-09-15 ENCOUNTER — Ambulatory Visit: Payer: BC Managed Care – PPO | Admitting: Gastroenterology

## 2014-08-01 ENCOUNTER — Ambulatory Visit (INDEPENDENT_AMBULATORY_CARE_PROVIDER_SITE_OTHER): Payer: BLUE CROSS/BLUE SHIELD | Admitting: Emergency Medicine

## 2014-08-01 ENCOUNTER — Encounter: Payer: Self-pay | Admitting: Emergency Medicine

## 2014-08-01 VITALS — BP 94/60 | HR 51 | Temp 99.1°F | Resp 16 | Ht 69.75 in | Wt 184.5 lb

## 2014-08-01 DIAGNOSIS — J209 Acute bronchitis, unspecified: Secondary | ICD-10-CM

## 2014-08-01 MED ORDER — ALBUTEROL SULFATE HFA 108 (90 BASE) MCG/ACT IN AERS: 2.0000 | INHALATION_SPRAY | RESPIRATORY_TRACT | Status: DC | PRN

## 2014-08-01 MED ORDER — AMOXICILLIN-POT CLAVULANATE 875-125 MG PO TABS: 1.0000 | ORAL_TABLET | Freq: Two times a day (BID) | ORAL | Status: DC

## 2014-08-01 MED ORDER — PSEUDOEPHEDRINE-GUAIFENESIN ER 60-600 MG PO TB12: 1.0000 | ORAL_TABLET | Freq: Two times a day (BID) | ORAL | Status: DC

## 2014-08-01 MED ORDER — PSEUDOEPHEDRINE-GUAIFENESIN ER 60-600 MG PO TB12: 1.0000 | ORAL_TABLET | Freq: Two times a day (BID) | ORAL | Status: AC

## 2014-08-01 MED ORDER — HYDROCOD POLST-CPM POLST ER 10-8 MG/5ML PO SUER: 5.0000 mL | Freq: Two times a day (BID) | ORAL | Status: DC

## 2014-08-01 NOTE — Progress Notes (Signed)
Subjective:  Patient ID: Jeremy [name], male    DOB: 1980-10-02  Age: 34 y.o. MRN: [id number]  CC: Cough; Night Sweats; and Depression   HPI Jeremy [name] presents for valuation of treatment of his cough. He's had for 5 day history of nasal congestion and postnasal drainage is purulent in color. He has night sweats and fever and fatigue during the day. Has a cough productive of scant sputum with wheezing. He has no shortness of breath. He has no nausea or vomiting. No stool change. No rash.  He's had no improvement in his symptoms with over-the-counter medication. He denies any other acute medical problem today.  History Jeremy [name] has a past medical history of Vertigo; GERD (gastroesophageal reflux disease); Allergy; and Anxiety.   He has past surgical history that includes Neck surgery; Eye surgery; Tonsillectomy; and Fracture surgery.   His family history includes Hyperlipidemia in his mother; Stroke in his father and maternal grandmother; Thyroid disease in his mother.He reports that he has never smoked. He has never used smokeless tobacco. He reports that he drinks alcohol. He reports that he does not use illicit drugs.  Outpatient Prescriptions Prior to Visit  Medication Sig Dispense Refill  . Multiple Vitamin (MULTIVITAMIN WITH MINERALS) TABS Take 1 tablet by mouth daily.    Marland Kitchen aspirin 325 MG tablet Take 325 mg by mouth every 4 (four) hours as needed. For pain    . pantoprazole (PROTONIX) 40 MG tablet Take 1 tablet (40 mg total) by mouth daily. 30 tablet 11   No facility-administered medications prior to visit.    ROS Review of Systems  Constitutional: Positive for fever and fatigue. Negative for chills and appetite change.  HENT: Positive for congestion, postnasal drip and sinus pressure. Negative for ear pain and sore throat.   Eyes: Negative for pain and redness.  Respiratory: Positive for cough and wheezing. Negative for shortness of breath.   Cardiovascular: Negative for  leg swelling.  Gastrointestinal: Negative for nausea, vomiting, abdominal pain, diarrhea, constipation and blood in stool.  Endocrine: Negative for polyuria.  Genitourinary: Negative for dysuria, urgency, frequency and flank pain.  Musculoskeletal: Negative for gait problem.  Skin: Negative for rash.  Neurological: Negative for weakness and headaches.  Psychiatric/Behavioral: Negative for confusion and decreased concentration. The patient is not nervous/anxious.     Objective:  BP 94/60 mmHg  Pulse 51  Temp(Src) 99.1 F (37.3 C) (Oral)  Resp 16  Ht 5' 9.75" (1.772 m)  Wt 184 lb 8 oz (83.689 kg)  BMI 26.65 kg/m2  SpO2 99%  Physical Exam  Constitutional: He is oriented to person, place, and time. He appears well-developed and well-nourished. No distress.  HENT:  Head: Normocephalic and atraumatic.  Right Ear: External ear normal.  Left Ear: External ear normal.  Nose: Nose normal.  Eyes: Conjunctivae and EOM are normal. Pupils are equal, round, and reactive to light. No scleral icterus.  Neck: Normal range of motion. Neck supple. No tracheal deviation present.  Cardiovascular: Normal rate, regular rhythm and normal heart sounds.   Pulmonary/Chest: Effort normal. No respiratory distress. He has no wheezes. He has no rales.  Abdominal: He exhibits no mass. There is no tenderness. There is no rebound and no guarding.  Musculoskeletal: He exhibits no edema.  Lymphadenopathy:    He has no cervical adenopathy.  Neurological: He is alert and oriented to person, place, and time. Coordination normal.  Skin: Skin is warm and dry. No rash noted.  Psychiatric: He has a normal mood  and affect. His behavior is normal.      Assessment & Plan:   Jeremy [name] was seen today for cough, night sweats and depression.  Diagnoses and all orders for this visit:  Acute bronchitis, unspecified organism  Other orders -     amoxicillin-clavulanate (AUGMENTIN) 875-125 MG per tablet; Take 1 tablet by  mouth 2 (two) times daily. -     pseudoephedrine-guaifenesin (MUCINEX D) 60-600 MG per tablet; Take 1 tablet by mouth every 12 (twelve) hours. -     chlorpheniramine-HYDROcodone (TUSSIONEX PENNKINETIC ER) 10-8 MG/5ML SUER; Take 5 mLs by mouth 2 (two) times daily. -     albuterol (PROVENTIL HFA;VENTOLIN HFA) 108 (90 BASE) MCG/ACT inhaler; Inhale 2 puffs into the lungs every 4 (four) hours as needed for wheezing or shortness of breath (cough, shortness of breath or wheezing.).   I have discontinued [name] aspirin and pantoprazole. I am also having him start on amoxicillin-clavulanate, pseudoephedrine-guaifenesin, chlorpheniramine-HYDROcodone, and albuterol. Additionally, I am having him maintain his multivitamin with minerals, cloNIDine, and traZODone.  Meds ordered this encounter  Medications  . cloNIDine (CATAPRES) 0.1 MG tablet    Sig:     Refill:  0  . traZODone (DESYREL) 25 mg TABS tablet    Sig: Take 25 mg by mouth at bedtime.  Marland Kitchen amoxicillin-clavulanate (AUGMENTIN) 875-125 MG per tablet    Sig: Take 1 tablet by mouth 2 (two) times daily.    Dispense:  20 tablet    Refill:  0  . pseudoephedrine-guaifenesin (MUCINEX D) 60-600 MG per tablet    Sig: Take 1 tablet by mouth every 12 (twelve) hours.    Dispense:  18 tablet    Refill:  0  . chlorpheniramine-HYDROcodone (TUSSIONEX PENNKINETIC ER) 10-8 MG/5ML SUER    Sig: Take 5 mLs by mouth 2 (two) times daily.    Dispense:  60 mL    Refill:  0  . albuterol (PROVENTIL HFA;VENTOLIN HFA) 108 (90 BASE) MCG/ACT inhaler    Sig: Inhale 2 puffs into the lungs every 4 (four) hours as needed for wheezing or shortness of breath (cough, shortness of breath or wheezing.).    Dispense:  1 Inhaler    Refill:  1   He was instructed on red flags and appropriate action taken each case  Follow-up: Return if symptoms worsen or fail to improve.  Roselee Culver, MD

## 2014-08-01 NOTE — Patient Instructions (Signed)
Metered Dose Inhaler (No Spacer Used)  Inhaled medicines are the basis of treatment for asthma and other breathing problems. Inhaled medicine can only be effective if used properly. Good technique assures that the medicine reaches the lungs.  Metered dose inhalers (MDIs) are used to deliver a variety of inhaled medicines. These include quick relief or rescue medicines (such as bronchodilators) and controller medicines (such as corticosteroids). The medicine is delivered by pushing down on a metal canister to release a set amount of spray.  If you are using different kinds of inhalers, use your quick relief medicine to open the airways 10-15 minutes before using a steroid, if instructed to do so by your health care provider. If you are unsure which inhalers to use and the order of using them, ask your health care provider, nurse, or respiratory therapist.  HOW TO USE THE INHALER  1. Remove the cap from the inhaler.  2. If you are using the inhaler for the first time, you will need to prime it. Shake the inhaler for 5 seconds and release four puffs into the air, away from your face. Ask your health care provider or pharmacist if you have questions about priming your inhaler.  3. Shake the inhaler for 5 seconds before each breath in (inhalation).  4. Position the inhaler so that the top of the canister faces up.  5. Put your index finger on the top of the medicine canister. Your thumb supports the bottom of the inhaler.  6. Open your mouth.  7. Either place the inhaler between your teeth and place your lips tightly around the mouthpiece, or hold the inhaler 1-2 inches away from your open mouth. If you are unsure of which technique to use, ask your health care provider.  8. Breathe out (exhale) normally and as completely as possible.  9. Press the canister down with the index finger to release the medicine.  10. At the same time as the canister is pressed, inhale deeply and slowly until your lungs are completely filled.  This should take 4-6 seconds. Keep your tongue down.  11. Hold the medicine in your lungs for 5-10 seconds (10 seconds is best). This helps the medicine get into the small airways of your lungs.  12. Breathe out slowly, through pursed lips. Whistling is an example of pursed lips.  13. Wait at least 1 minute between puffs. Continue with the above steps until you have taken the number of puffs your health care provider has ordered. Do not use the inhaler more than your health care provider directs you to.  14. Replace the cap on the inhaler.  15. Follow the directions from your health care provider or the inhaler insert for cleaning the inhaler.  If you are using a steroid inhaler, after your last puff, rinse your mouth with water, gargle, and spit out the water. Do not swallow the water.  AVOID:  · Inhaling before or after starting the spray of medicine. It takes practice to coordinate your breathing with triggering the spray.  · Inhaling through the nose (rather than the mouth) when triggering the spray.  HOW TO DETERMINE IF YOUR INHALER IS FULL OR NEARLY EMPTY  You cannot know when an inhaler is empty by shaking it. Some inhalers are now being made with dose counters. Ask your health care provider for a prescription that has a dose counter if you feel you need that extra help. If your inhaler does not have a counter, ask your health care   provider to help you determine the date you need to refill your inhaler. Write the refill date on a calendar or your inhaler canister. Refill your inhaler 7-10 days before it runs out. Be sure to keep an adequate supply of medicine. This includes making sure it has not expired, and making sure you have a spare inhaler.  SEEK MEDICAL CARE IF:  · Symptoms are only partially relieved with your inhaler.  · You are having trouble using your inhaler.  · You experience an increase in phlegm.  SEEK IMMEDIATE MEDICAL CARE IF:  · You feel little or no relief with your inhalers. You are still  wheezing and feeling shortness of breath, tightness in your chest, or both.  · You have dizziness, headaches, or a fast heart rate.  · You have chills, fever, or night sweats.  · There is a noticeable increase in phlegm production, or there is blood in the phlegm.  MAKE SURE YOU:  · Understand these instructions.  · Will watch your condition.  · Will get help right away if you are not doing well or get worse.  Document Released: 12/23/2006 Document Revised: 07/12/2013 Document Reviewed: 08/13/2012  ExitCare® Patient Information ©2015 [employer]. This information is not intended to replace advice given to you by your health care provider. Make sure you discuss any questions you have with your health care provider.

## 2014-08-01 NOTE — Addendum Note (Signed)
Addended by: Burnis Kingfisher on: 08/01/2014 08:11 PM   Modules accepted: Orders

## 2014-08-04 ENCOUNTER — Ambulatory Visit (INDEPENDENT_AMBULATORY_CARE_PROVIDER_SITE_OTHER): Payer: BLUE CROSS/BLUE SHIELD | Admitting: Emergency Medicine

## 2014-08-04 VITALS — BP 110/70 | HR 64 | Temp 98.9°F | Resp 20 | Ht 69.5 in | Wt 183.2 lb

## 2014-08-04 DIAGNOSIS — K219 Gastro-esophageal reflux disease without esophagitis: Secondary | ICD-10-CM

## 2014-08-04 DIAGNOSIS — R1013 Epigastric pain: Secondary | ICD-10-CM | POA: Diagnosis not present

## 2014-08-04 MED ORDER — SUCRALFATE 1 G PO TABS: ORAL_TABLET | ORAL | Status: DC

## 2014-08-04 MED ORDER — LANSOPRAZOLE 30 MG PO CPDR: 30.0000 mg | DELAYED_RELEASE_CAPSULE | Freq: Every day | ORAL | Status: DC

## 2014-08-04 NOTE — Progress Notes (Signed)
Subjective:  Patient ID: Jeremy [name], male    DOB: Nov 15, 1980  Age: 34 y.o. MRN: [id number]  CC: Abdominal Pain and Muscle Pain   HPI Gagan Dillion presents for evaluation of his abdominal pain. He has a history of gastroesophageal reflux disorder and was offered an endoscopy and started on medication a year ago. He stopped taking the medication has persistent symptoms of water brash and heartburn. He has no nausea vomiting or stool change has food intolerance specifically related fried fatty foods associated with right upper quadrant pain into his back. No stool change no fever chills or icterus. He's not been compliant with medical treatment. He has had no improvement with over-the-counter medication specifically taking gas ex  Outpatient Prescriptions Prior to Visit  Medication Sig Dispense Refill  . albuterol (PROVENTIL HFA;VENTOLIN HFA) 108 (90 BASE) MCG/ACT inhaler Inhale 2 puffs into the lungs every 4 (four) hours as needed for wheezing or shortness of breath (cough, shortness of breath or wheezing.). 1 Inhaler 1  . amoxicillin-clavulanate (AUGMENTIN) 875-125 MG per tablet Take 1 tablet by mouth 2 (two) times daily. 20 tablet 0  . chlorpheniramine-HYDROcodone (TUSSIONEX PENNKINETIC ER) 10-8 MG/5ML SUER Take 5 mLs by mouth 2 (two) times daily. 60 mL 0  . cloNIDine (CATAPRES) 0.1 MG tablet   0  . Multiple Vitamin (MULTIVITAMIN WITH MINERALS) TABS Take 1 tablet by mouth daily.    . pseudoephedrine-guaifenesin (MUCINEX D) 60-600 MG per tablet Take 1 tablet by mouth every 12 (twelve) hours. 18 tablet 0  . traZODone (DESYREL) 25 mg TABS tablet Take 25 mg by mouth at bedtime.     No facility-administered medications prior to visit.    History   Social History  . Marital Status: Significant Other    Spouse Name: N/A  . Number of Children: N/A  . Years of Education: N/A   Social History Main Topics  . Smoking status: Never Smoker   . Smokeless tobacco: Never Used  . Alcohol Use:  0.0 oz/week    1-2 Standard drinks or equivalent per week  . Drug Use: No  . Sexual Activity: Yes   Other Topics Concern  . None   Social History Narrative    Family History  Problem Relation Age of Onset  . Thyroid disease Mother   . Hyperlipidemia Mother   . Stroke Father   . Stroke Maternal Grandmother     Past Medical History  Diagnosis Date  . Vertigo   . GERD (gastroesophageal reflux disease)   . Allergy   . Anxiety      Review of Systems  Constitutional: Positive for appetite change. Negative for fever and chills.  HENT: Negative for congestion, ear pain, postnasal drip, sinus pressure and sore throat.   Eyes: Negative for pain and redness.  Respiratory: Negative for cough, shortness of breath and wheezing.   Cardiovascular: Negative for leg swelling.  Gastrointestinal: Positive for nausea and abdominal pain. Negative for vomiting, diarrhea, constipation and blood in stool.  Endocrine: Negative for polyuria.  Genitourinary: Negative for dysuria, urgency, frequency and flank pain.  Musculoskeletal: Negative for gait problem.  Skin: Negative for rash.  Neurological: Negative for weakness and headaches.  Psychiatric/Behavioral: Negative for confusion and decreased concentration. The patient is not nervous/anxious.     Objective:  BP 110/70 mmHg  Pulse 64  Temp(Src) 98.9 F (37.2 C) (Oral)  Resp 20  Ht 5' 9.5" (1.765 m)  Wt 183 lb 4 oz (83.122 kg)  BMI 26.68 kg/m2  SpO2 99%  BP Readings from Last 3 Encounters:  08/04/14 110/70  08/01/14 94/60  08/12/13 110/60    Wt Readings from Last 3 Encounters:  08/04/14 183 lb 4 oz (83.122 kg)  08/01/14 184 lb 8 oz (83.689 kg)  08/12/13 183 lb (83.008 kg)    Physical Exam  Constitutional: He is oriented to person, place, and time. He appears well-developed and well-nourished.  HENT:  Head: Normocephalic and atraumatic.  Eyes: Conjunctivae are normal. Pupils are equal, round, and reactive to light.  Neck:  Normal range of motion. Neck supple.  Cardiovascular: Normal rate and regular rhythm.   Pulmonary/Chest: Effort normal.  Abdominal: Soft. Bowel sounds are normal. He exhibits no distension and no mass. There is tenderness. There is no rebound and no guarding.  Musculoskeletal: He exhibits no edema.  Neurological: He is alert and oriented to person, place, and time.  Skin: Skin is warm and dry.  Psychiatric: He has a normal mood and affect. His behavior is normal. Thought content normal.    Lab Results  Component Value Date   WBC 4.7 08/12/2013   HGB 14.1 08/12/2013   HCT 41.9 08/12/2013   PLT 206.0 08/12/2013   GLUCOSE 80 08/12/2013   ALT 32 08/12/2013   AST 34 08/12/2013   NA 139 08/12/2013   K 3.5 08/12/2013   CL 105 08/12/2013   CREATININE 1.0 08/12/2013   BUN 8 08/12/2013   CO2 29 08/12/2013   TSH 0.60 08/12/2013      .  Assessment & Plan:   Dalten was seen today for abdominal pain and muscle pain.  Diagnoses and all orders for this visit:  Gastroesophageal reflux disease, esophagitis presence not specified Orders: -     Ambulatory referral to Gastroenterology  Epigastric pain Orders: -     US Abdomen Limited RUQ; Future -     Ambulatory referral to Gastroenterology  Other orders -     lansoprazole (PREVACID) 30 MG capsule; Take 1 capsule (30 mg total) by mouth daily at 12 noon. -     sucralfate (CARAFATE) 1 G tablet; 1 tablet 1 hr ac and hs   I am having Mr. Kearley start on lansoprazole and sucralfate. I am also having him maintain his multivitamin with minerals, cloNIDine, traZODone, albuterol, amoxicillin-clavulanate, chlorpheniramine-HYDROcodone, and pseudoephedrine-guaifenesin.  Meds ordered this encounter  Medications  . lansoprazole (PREVACID) 30 MG capsule    Sig: Take 1 capsule (30 mg total) by mouth daily at 12 noon.    Dispense:  30 capsule    Refill:  5  . sucralfate (CARAFATE) 1 G tablet    Sig: 1 tablet 1 hr ac and hs    Dispense:  120  tablet    Refill:  0     Follow-up: Return in about 4 weeks (around 09/01/2014).  Roselee Culver, MD

## 2014-08-04 NOTE — Patient Instructions (Signed)
Gastroesophageal Reflux Disease, Adult Gastroesophageal reflux disease (GERD) happens when acid from your stomach flows up into the esophagus. When acid comes in contact with the esophagus, the acid causes soreness (inflammation) in the esophagus. Over time, GERD may create small holes (ulcers) in the lining of the esophagus. CAUSES   Increased body weight. This puts pressure on the stomach, making acid rise from the stomach into the esophagus.  Smoking. This increases acid production in the stomach.  Drinking alcohol. This causes decreased pressure in the lower esophageal sphincter (valve or ring of muscle between the esophagus and stomach), allowing acid from the stomach into the esophagus.  Late evening meals and a full stomach. This increases pressure and acid production in the stomach.  A malformed lower esophageal sphincter. Sometimes, no cause is found. SYMPTOMS   Burning pain in the lower part of the mid-chest behind the breastbone and in the mid-stomach area. This may occur twice a week or more often.  Trouble swallowing.  Sore throat.  Dry cough.  Asthma-like symptoms including chest tightness, shortness of breath, or wheezing. DIAGNOSIS  Your caregiver may be able to diagnose GERD based on your symptoms. In some cases, X-rays and other tests may be done to check for complications or to check the condition of your stomach and esophagus. TREATMENT  Your caregiver may recommend over-the-counter or prescription medicines to help decrease acid production. Ask your caregiver before starting or adding any new medicines.  HOME CARE INSTRUCTIONS   Change the factors that you can control. Ask your caregiver for guidance concerning weight loss, quitting smoking, and alcohol consumption.  Avoid foods and drinks that make your symptoms worse, such as:  Caffeine or alcoholic drinks.  Chocolate.  Peppermint or mint flavorings.  Garlic and onions.  Spicy foods.  Citrus fruits,  such as oranges, lemons, or limes.  Tomato-based foods such as sauce, chili, salsa, and pizza.  Fried and fatty foods.  Avoid lying down for the 3 hours prior to your bedtime or prior to taking a nap.  Eat small, frequent meals instead of large meals.  Wear loose-fitting clothing. Do not wear anything tight around your waist that causes pressure on your stomach.  Raise the head of your bed 6 to 8 inches with wood blocks to help you sleep. Extra pillows will not help.  Only take over-the-counter or prescription medicines for pain, discomfort, or fever as directed by your caregiver.  Do not take aspirin, ibuprofen, or other nonsteroidal anti-inflammatory drugs (NSAIDs). SEEK IMMEDIATE MEDICAL CARE IF:   You have pain in your arms, neck, jaw, teeth, or back.  Your pain increases or changes in intensity or duration.  You develop nausea, vomiting, or sweating (diaphoresis).  You develop shortness of breath, or you faint.  Your vomit is green, yellow, black, or looks like coffee grounds or blood.  Your stool is red, bloody, or black. These symptoms could be signs of other problems, such as heart disease, gastric bleeding, or esophageal bleeding. MAKE SURE YOU:   Understand these instructions.  Will watch your condition.  Will get help right away if you are not doing well or get worse. Document Released: 12/05/2004 Document Revised: 05/20/2011 Document Reviewed: 09/14/2010 Exit[employer] Patient Information 2015 [employer]. This information is not intended to replace advice given to you by your health care provider. Make sure you discuss any questions you have with your health care provider.  

## 2014-08-16 ENCOUNTER — Other Ambulatory Visit: Payer: Self-pay

## 2014-08-29 ENCOUNTER — Ambulatory Visit
Admission: RE | Admit: 2014-08-29 | Discharge: 2014-08-29 | Disposition: A | Discharge status: Home / Self Care | Payer: BLUE CROSS/BLUE SHIELD | Source: Ambulatory Visit | Attending: Emergency Medicine | Admitting: Emergency Medicine

## 2014-08-29 DIAGNOSIS — R1013 Epigastric pain: Secondary | ICD-10-CM

## 2014-09-27 NOTE — Progress Notes (Signed)
Pt did not pick up Tussionex from 08/01/14 OV. Shredded

## 2017-03-08 IMAGING — US US ABDOMEN LIMITED
1 series · 14 of 25 positions shown · non-contrast
Comparison: None.

CLINICAL DATA: GERD

EXAM:
US ABDOMEN LIMITED - RIGHT UPPER QUADRANT

[Series 1: us abdomen limited · 0.17mm/px · 14 of 41 slices shown]
[im 1/41]
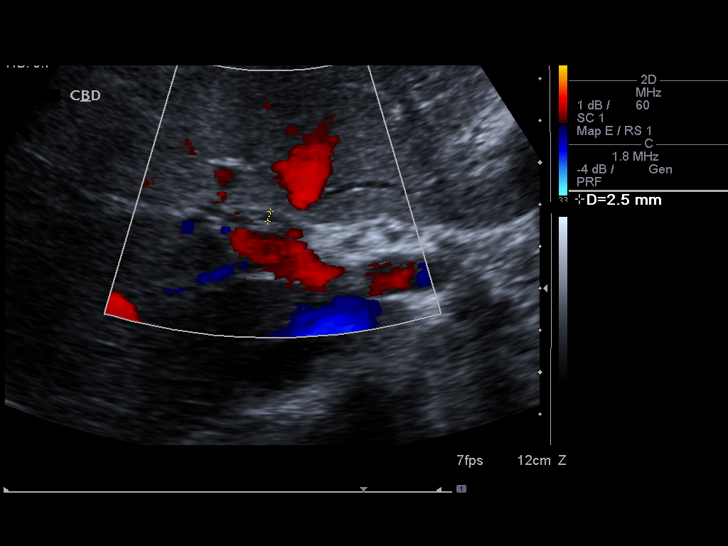
[im 4/41]
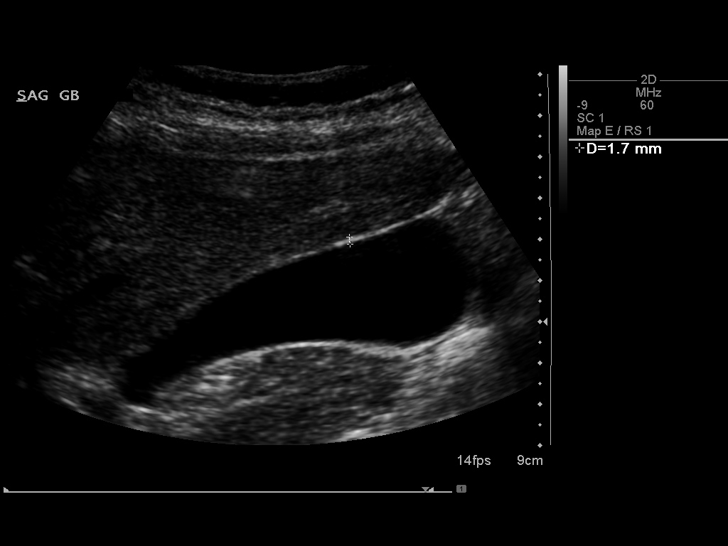
[im 7/41]
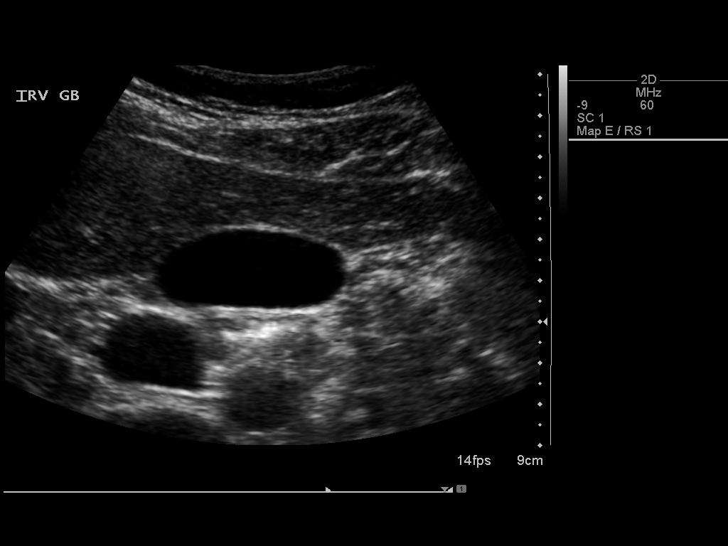
[im 11/41]
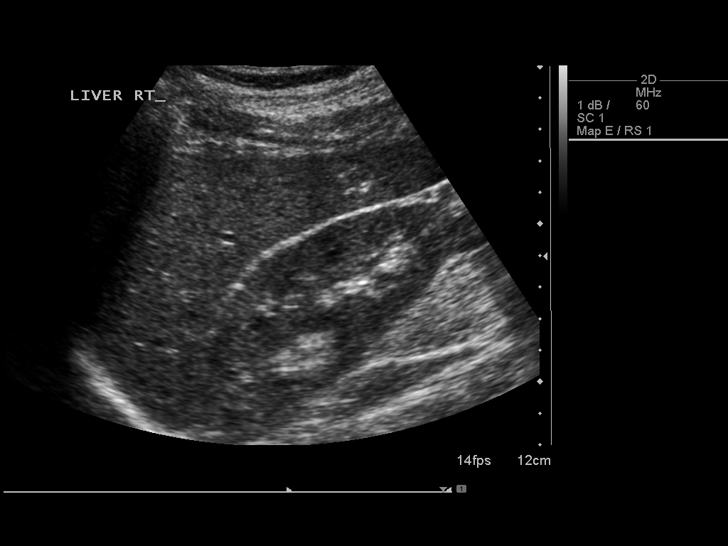
[im 14/41]
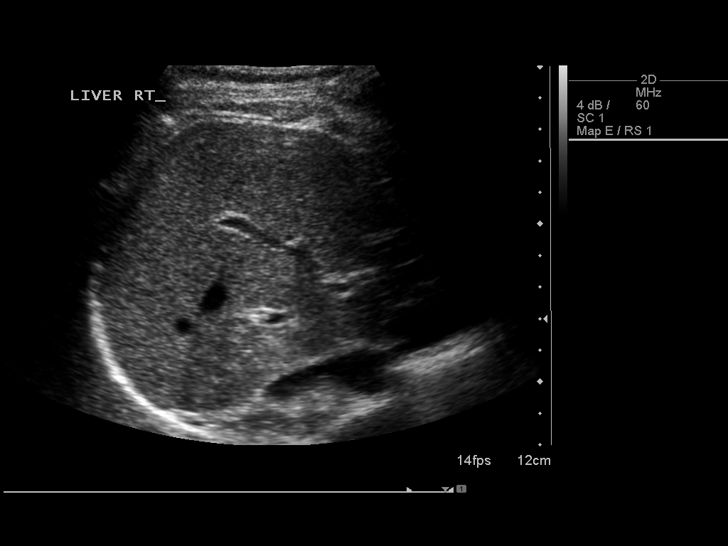
[im 16/41]
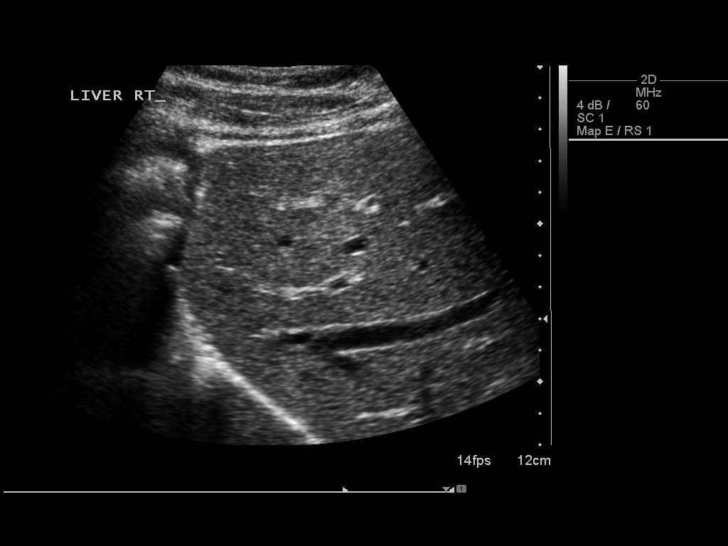
[im 19/41]
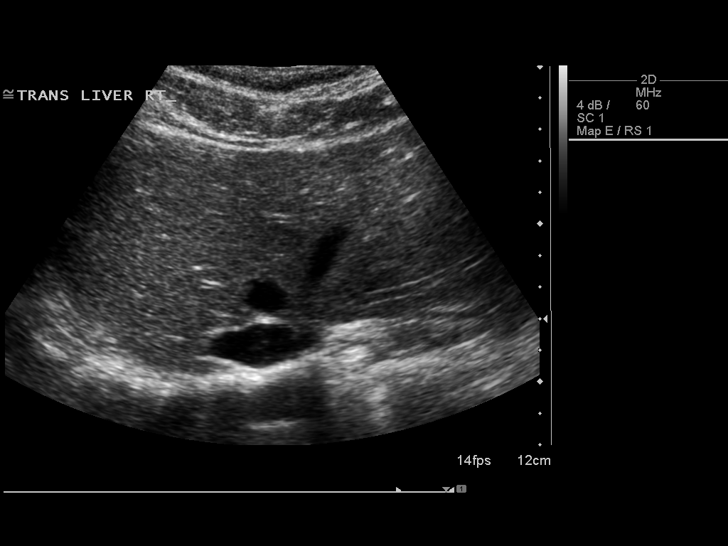
[im 22/41]
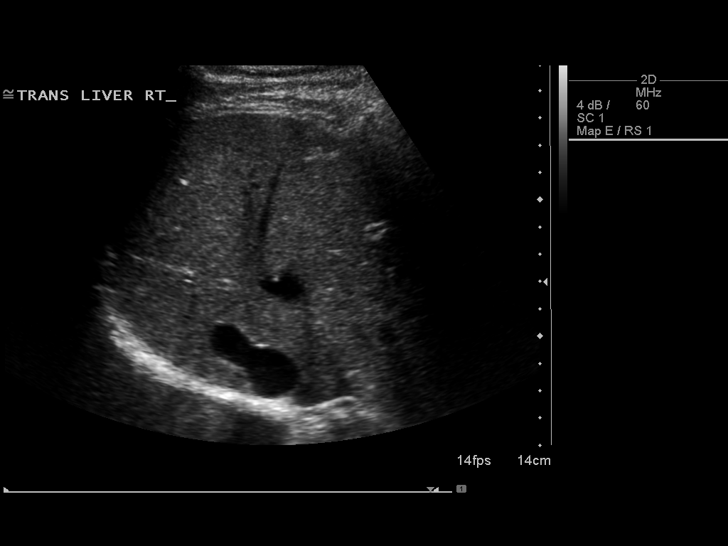
[im 26/41]
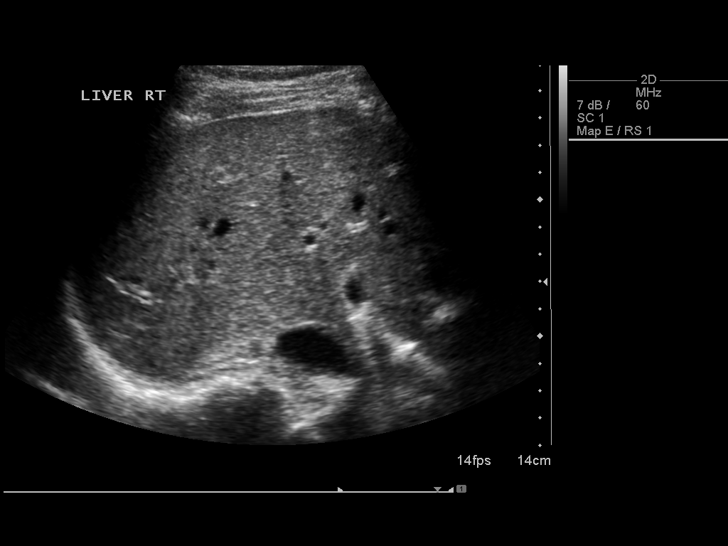
[im 27/41]
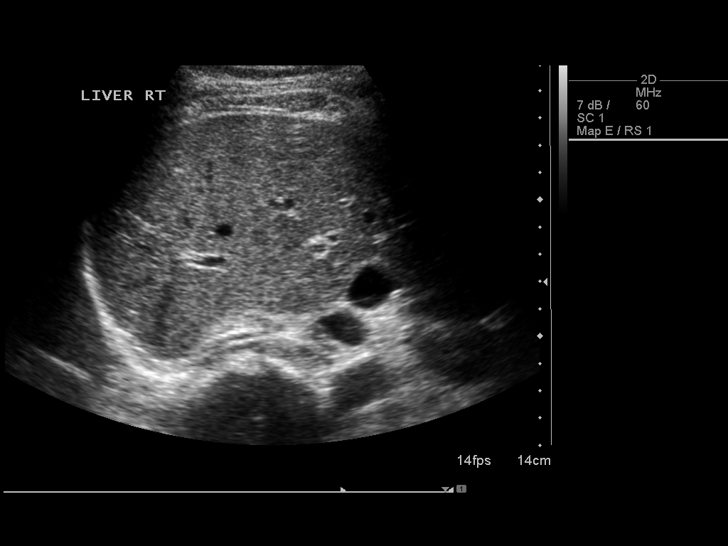
[im 31/41]
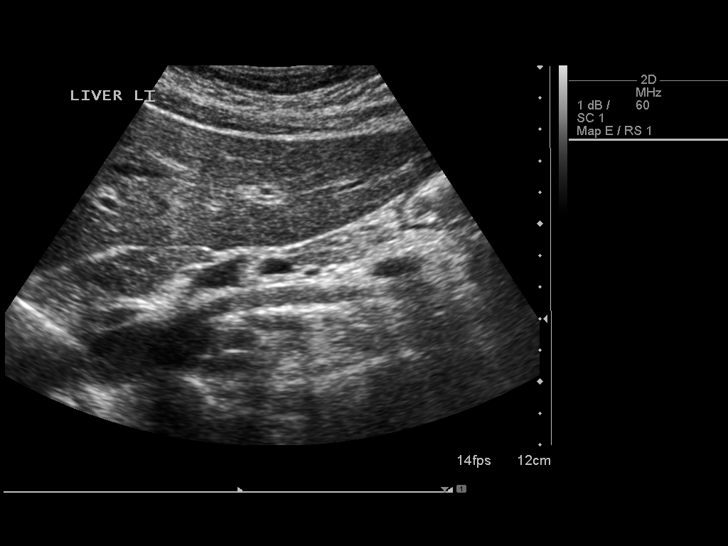
[im 34/41]
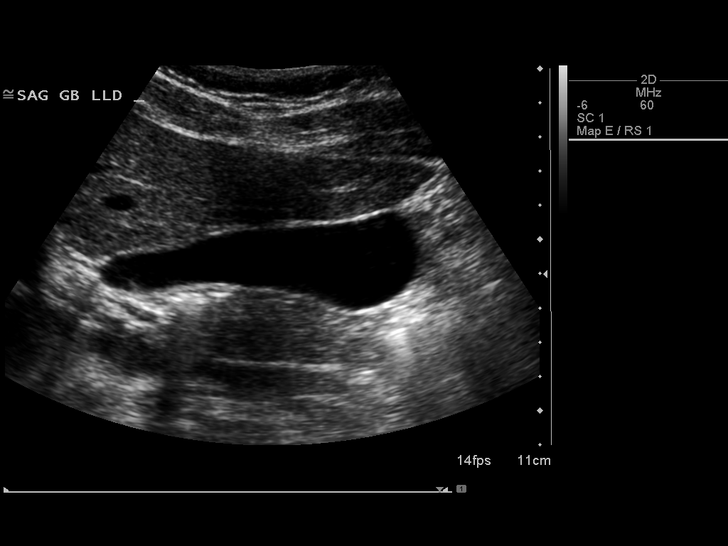
[im 37/41]
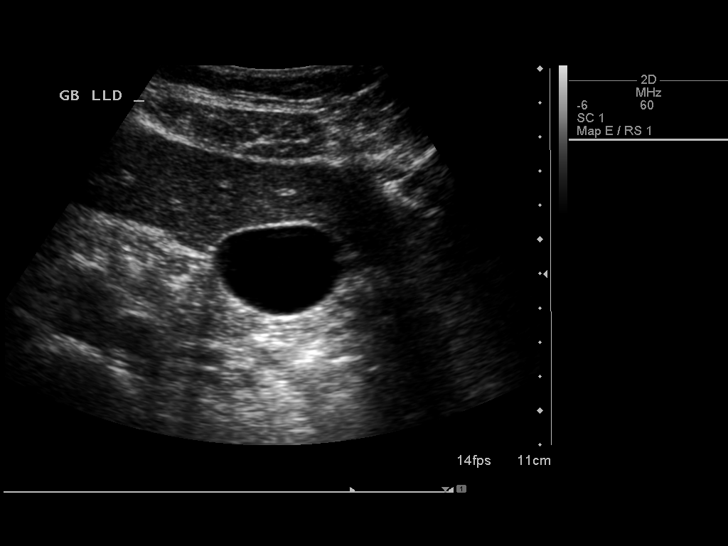
[im 41/41]
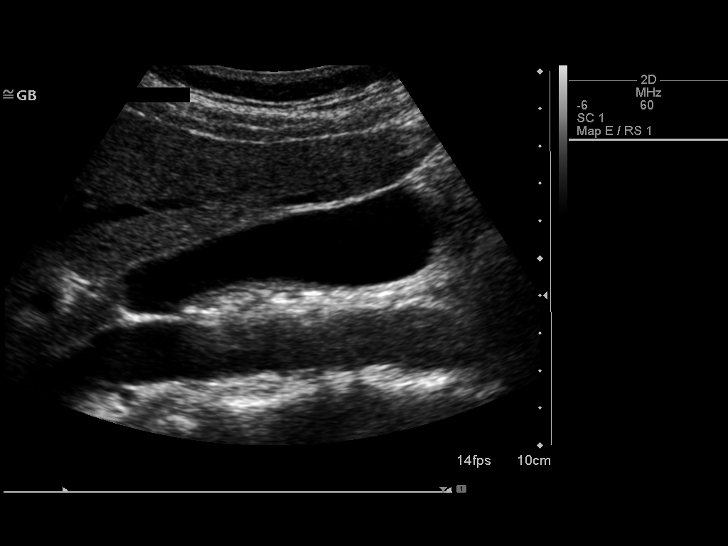

[14 of 25 positions shown; findings below may reference images not displayed]

FINDINGS: Gallbladder:

No gallstones or wall thickening visualized. No sonographic Murphy
sign noted.

Common bile duct:

Diameter: 2.5 mm in diameter

Liver:

No focal lesion identified. Within normal limits in parenchymal
echogenicity.
IMPRESSION: Unremarkable right upper quadrant ultrasound.

## 2019-06-30 ENCOUNTER — Ambulatory Visit (HOSPITAL_COMMUNITY)
Admission: EM | Admit: 2019-06-30 | Discharge: 2019-06-30 | Disposition: A | Discharge status: Home / Self Care | Payer: Self-pay | Attending: Emergency Medicine | Admitting: Emergency Medicine

## 2019-06-30 ENCOUNTER — Other Ambulatory Visit: Payer: Self-pay

## 2019-06-30 ENCOUNTER — Encounter (HOSPITAL_COMMUNITY): Payer: Self-pay

## 2019-06-30 DIAGNOSIS — M62830 Muscle spasm of back: Secondary | ICD-10-CM | POA: Insufficient documentation

## 2019-06-30 DIAGNOSIS — M545 Low back pain, unspecified: Secondary | ICD-10-CM

## 2019-06-30 LAB — POCT URINALYSIS DIP (DEVICE)
Bilirubin Urine: NEGATIVE
Glucose, UA: NEGATIVE mg/dL
Hgb urine dipstick: NEGATIVE
Ketones, ur: NEGATIVE mg/dL
Leukocytes,Ua: NEGATIVE
Nitrite: NEGATIVE
Protein, ur: NEGATIVE mg/dL
Specific Gravity, Urine: >=1.03 (ref 1.005–1.030)
Urobilinogen, UA: 0.2 mg/dL (ref 0.0–1.0)
pH: 5.5 (ref 5.0–8.0)

## 2019-06-30 MED ORDER — METHOCARBAMOL 500 MG PO TABS: 500.0000 mg | ORAL_TABLET | Freq: Two times a day (BID) | ORAL | 0 refills | Status: DC

## 2019-06-30 MED ORDER — NAPROXEN 500 MG PO TABS: 500.0000 mg | ORAL_TABLET | Freq: Two times a day (BID) | ORAL | 0 refills | Status: DC

## 2019-06-30 NOTE — ED Triage Notes (Addendum)
Pt c/o lumbar back pain for approx 10 days. States it started on right side,then began hurting bilaterally. States pain only on left lumbar region. Denies injury to area.  States used laxative tea and pain remained same after laxative effectiveness. Denies dysuria sx.  States pain worse during the day and when lying down.

## 2019-06-30 NOTE — ED Provider Notes (Signed)
[place]    CSN: [id number] Arrival date & time: 06/30/19  1804      History   Chief Complaint Chief Complaint  Patient presents with  . Back Pain    HPI Jeremy [name] is a 39 y.o. male.   Pt is here for lt low back flank pain for 4 days now. Denies any injury. Has  Taken several teas, laxatives thinking it was a bowel movement issue. He has had normal BM no blood in stool. States that is is worse while moving around during the day, in the evening it is better. Has been pushing fluids with no relief. States that his urine has been dark in color denies any urinary sx. No n/v/d.      Past Medical History:  Diagnosis Date  . Allergy   . Anxiety   . GERD (gastroesophageal reflux disease)   . Vertigo     Patient Active Problem List   Diagnosis Date Noted  . GERD (gastroesophageal reflux disease) 08/04/2014    Past Surgical History:  Procedure Laterality Date  . EYE SURGERY    . FRACTURE SURGERY    . NECK SURGERY    . TONSILLECTOMY         Home Medications    Prior to Admission medications   Medication Sig Start Date End Date Taking? Authorizing Provider  albuterol (PROVENTIL HFA;VENTOLIN HFA) 108 (90 BASE) MCG/ACT inhaler Inhale 2 puffs into the lungs every 4 (four) hours as needed for wheezing or shortness of breath (cough, shortness of breath or wheezing.). 08/01/14   Roselee Culver, MD  amoxicillin-clavulanate (AUGMENTIN) 875-125 MG per tablet Take 1 tablet by mouth 2 (two) times daily. 08/01/14   Roselee Culver, MD  chlorpheniramine-HYDROcodone [facility] PENNKINETIC ER) 10-8 MG/5ML SUER Take 5 mLs by mouth 2 (two) times daily. 08/01/14   Roselee Culver, MD  cloNIDine (CATAPRES) 0.1 MG tablet  07/23/14   [provider]  lansoprazole (PREVACID) 30 MG capsule Take 1 capsule (30 mg total) by mouth daily at 12 noon. 08/04/14   Roselee Culver, MD  methocarbamol (ROBAXIN) 500 MG tablet Take 1 tablet (500 mg total) by mouth 2  (two) times daily. 06/30/19   Marney Setting, NP  Multiple Vitamin (MULTIVITAMIN WITH MINERALS) TABS Take 1 tablet by mouth daily.    [provider]  naproxen (NAPROSYN) 500 MG tablet Take 1 tablet (500 mg total) by mouth 2 (two) times daily. 06/30/19   Marney Setting, NP  sucralfate (CARAFATE) 1 G tablet 1 tablet 1 hr ac and hs 08/04/14   Roselee Culver, MD  traZODone (DESYREL) 25 mg TABS tablet Take 25 mg by mouth at bedtime.    [provider]    Family History Family History  Problem Relation Age of Onset  . Thyroid disease Mother   . Hyperlipidemia Mother   . Stroke Father   . Stroke Maternal Grandmother     Social History Social History   Tobacco Use  . Smoking status: Never Smoker  . Smokeless tobacco: Never Used  Substance Use Topics  . Alcohol use: Yes    Alcohol/week: 1.0 - 2.0 standard drinks    Types: 1 - 2 Standard drinks or equivalent per week  . Drug use: No     Allergies   Patient has no known allergies.   Review of Systems Review of Systems  Constitutional: Negative.   Respiratory: Negative.   Cardiovascular: Negative.   Gastrointestinal: Negative.  Genitourinary:       Dark urine   Musculoskeletal:       Low back flank area pain   Neurological: Negative.      Physical Exam Triage Vital Signs ED Triage Vitals  Enc Vitals Group     BP 06/30/19 1836 112/81     Pulse Rate 06/30/19 1836 70     Resp 06/30/19 1836 18     Temp 06/30/19 1836 98.7 F (37.1 C)     Temp Source 06/30/19 1836 Oral     SpO2 06/30/19 1836 100 %     Weight --      Height --      Head Circumference --      Peak Flow --      Pain Score 06/30/19 1832 4     Pain Loc --      Pain Edu? --      Excl. in [place]? --    No data found.  Updated Vital Signs BP 112/81 (BP Location: Right Arm)   Pulse 70   Temp 98.7 F (37.1 C) (Oral)   Resp 18   SpO2 100%   Visual Acuity     Physical Exam Cardiovascular:     Rate and Rhythm: Normal  rate.  Pulmonary:     Effort: Pulmonary effort is normal.  Abdominal:     General: Abdomen is flat.     Tenderness: There is left CVA tenderness.  Musculoskeletal:        General: Tenderness present.     Comments: Lt lower back more LT cv tenderness on palpation  Skin:    General: Skin is warm.     Capillary Refill: Capillary refill takes less than 2 seconds.  Neurological:     General: No focal deficit present.     Mental Status: He is alert.      UC Treatments / Results  Labs (all labs ordered are listed, but only abnormal results are displayed) Labs Reviewed  URINE CULTURE  POCT URINALYSIS DIP (DEVICE)    EKG   Radiology No results found.  Procedures Procedures (including critical care time)  Medications Ordered in UC Medications - No data to display  Initial Impression / Assessment and Plan / UC Course  I have reviewed the triage vital signs and the nursing notes.  Pertinent labs & imaging results that were available during my care of the patient were reviewed by me and considered in my medical decision making (see chart for details).     Warm compressed will help  Rest  If pain does not get better in 7-10 days you may need to have a ct scan  Do not drive while on the medication  Take nsaids as needed for pain  Final Clinical Impressions(s) / UC Diagnoses   Final diagnoses:  Acute left-sided low back pain without sciatica  Muscle spasm of back     Discharge Instructions     Warm compressed will help  Rest  If pain does not get better in 7-10 days you may need to have a ct scan  Do not drive while on the medication  Take nsaids as needed for pain      ED Prescriptions    Medication Sig Dispense Auth. Provider   methocarbamol (ROBAXIN) 500 MG tablet Take 1 tablet (500 mg total) by mouth 2 (two) times daily. 20 tablet Morley Kos L, NP   naproxen (NAPROSYN) 500 MG tablet Take 1 tablet (500 mg total) by mouth  2 (two) times daily. 30  tablet Marney Setting, NP     PDMP not reviewed this encounter.   Marney Setting, NP 06/30/19 1929

## 2019-06-30 NOTE — Discharge Instructions (Addendum)
Warm compressed will help  Rest  If pain does not get better in 7-10 days you may need to have a ct scan  Do not drive while on the medication  Take nsaids as needed for pain

## 2019-07-02 LAB — URINE CULTURE: Culture: <10000 — AB

## 2021-04-10 ENCOUNTER — Other Ambulatory Visit: Payer: Self-pay

## 2021-04-10 ENCOUNTER — Ambulatory Visit (HOSPITAL_COMMUNITY)
Admission: EM | Admit: 2021-04-10 | Discharge: 2021-04-10 | Disposition: A | Discharge status: Home / Self Care | Payer: BC Managed Care – PPO | Attending: Family Medicine | Admitting: Family Medicine

## 2021-04-10 ENCOUNTER — Encounter (HOSPITAL_COMMUNITY): Payer: Self-pay

## 2021-04-10 DIAGNOSIS — U071 COVID-19: Secondary | ICD-10-CM | POA: Diagnosis present

## 2021-04-10 MED ORDER — NIRMATRELVIR/RITONAVIR (PAXLOVID)TABLET: ORAL_TABLET | ORAL | 0 refills | Status: DC

## 2021-04-10 NOTE — ED Provider Notes (Addendum)
[place]    CSN: [id number] Arrival date & time: 04/10/21  1643      History   Chief Complaint Chief Complaint  Patient presents with   Covid Positive   Cough    HPI Jeremy [name] is a 41 y.o. male.    Cough Here for a 2-day history of cough, myalgia, nasal congestion, and headache.  He has done home COVID test twice yesterday and they were both positive.  No vomiting or diarrhea.  No history of asthma.  Calculated BMI is 26.9  Past Medical History:  Diagnosis Date   Allergy    Anxiety    GERD (gastroesophageal reflux disease)    Vertigo     Patient Active Problem List   Diagnosis Date Noted   GERD (gastroesophageal reflux disease) 08/04/2014    Past Surgical History:  Procedure Laterality Date   EYE SURGERY     FRACTURE SURGERY     NECK SURGERY     TONSILLECTOMY         Home Medications    Prior to Admission medications   Medication Sig Start Date End Date Taking? Authorizing Provider  nirmatrelvir/ritonavir EUA (PAXLOVID) 20 x 150 MG & 10 x 100MG TABS Take nirmatrelvir (150 mg) two tablets twice daily for 5 days and ritonavir (100 mg) one tablet twice daily for 5 days. 04/10/21  Yes Azaiah Mello, Gwenlyn Perking, MD  albuterol (PROVENTIL HFA;VENTOLIN HFA) 108 (90 BASE) MCG/ACT inhaler Inhale 2 puffs into the lungs every 4 (four) hours as needed for wheezing or shortness of breath (cough, shortness of breath or wheezing.). 08/01/14   Roselee Culver, MD  chlorpheniramine-HYDROcodone [facility] PENNKINETIC ER) 10-8 MG/5ML SUER Take 5 mLs by mouth 2 (two) times daily. 08/01/14   Roselee Culver, MD  cloNIDine (CATAPRES) 0.1 MG tablet  07/23/14   [provider]  lansoprazole (PREVACID) 30 MG capsule Take 1 capsule (30 mg total) by mouth daily at 12 noon. 08/04/14   Roselee Culver, MD  methocarbamol (ROBAXIN) 500 MG tablet Take 1 tablet (500 mg total) by mouth 2 (two) times daily. 06/30/19   Marney Setting, NP  Multiple Vitamin  (MULTIVITAMIN WITH MINERALS) TABS Take 1 tablet by mouth daily.    [provider]  naproxen (NAPROSYN) 500 MG tablet Take 1 tablet (500 mg total) by mouth 2 (two) times daily. 06/30/19   Marney Setting, NP  sucralfate (CARAFATE) 1 G tablet 1 tablet 1 hr ac and hs 08/04/14   Roselee Culver, MD  traZODone (DESYREL) 25 mg TABS tablet Take 25 mg by mouth at bedtime.    [provider]    Family History Family History  Problem Relation Age of Onset   Thyroid disease Mother    Hyperlipidemia Mother    Stroke Father    Stroke Maternal Grandmother     Social History Social History   Tobacco Use   Smoking status: Never   Smokeless tobacco: Never  Substance Use Topics   Alcohol use: Yes    Alcohol/week: 1.0 - 2.0 standard drink    Types: 1 - 2 Standard drinks or equivalent per week   Drug use: No     Allergies   Patient has no known allergies.   Review of Systems Review of Systems  Respiratory:  Positive for cough.     Physical Exam Triage Vital Signs ED Triage Vitals  Enc Vitals Group     BP 04/10/21 1815 116/73     Pulse  Rate 04/10/21 1815 (!) 55     Resp 04/10/21 1815 16     Temp 04/10/21 1815 (!) 100.4 F (38 C)     Temp Source 04/10/21 1815 Oral     SpO2 04/10/21 1815 100 %     Weight --      Height --      Head Circumference --      Peak Flow --      Pain Score 04/10/21 1816 5     Pain Loc --      Pain Edu? --      Excl. in [place]? --    No data found.  Updated Vital Signs BP 116/73 (BP Location: Right Arm)    Pulse (!) 55    Temp (!) 100.4 F (38 C) (Oral)    Resp 16    SpO2 100%   Visual Acuity Right Eye Distance:   Left Eye Distance:   Bilateral Distance:    Right Eye Near:   Left Eye Near:    Bilateral Near:     Physical Exam Vitals reviewed.  Constitutional:      General: He is not in acute distress.    Appearance: He is not toxic-appearing.  HENT:     Right Ear: Tympanic membrane and ear canal normal.     Left  Ear: Tympanic membrane and ear canal normal.     Nose: Nose normal.     Mouth/Throat:     Mouth: Mucous membranes are moist.     Pharynx: No oropharyngeal exudate or posterior oropharyngeal erythema (mild posterior OP).  Eyes:     Extraocular Movements: Extraocular movements intact.     Conjunctiva/sclera: Conjunctivae normal.     Pupils: Pupils are equal, round, and reactive to light.  Cardiovascular:     Rate and Rhythm: Normal rate and regular rhythm.     Heart sounds: No murmur heard. Pulmonary:     Effort: Pulmonary effort is normal.     Breath sounds: Normal breath sounds.  Musculoskeletal:     Cervical back: Neck supple.  Lymphadenopathy:     Cervical: No cervical adenopathy.  Skin:    Capillary Refill: Capillary refill takes less than 2 seconds.     Coloration: Skin is not jaundiced or pale.  Neurological:     General: No focal deficit present.     Mental Status: He is alert and oriented to person, place, and time.  Psychiatric:        Behavior: Behavior normal.     UC Treatments / Results  Labs (all labs ordered are listed, but only abnormal results are displayed) Labs Reviewed  SARS CORONAVIRUS 2 (TAT 6-24 HRS)    EKG   Radiology No results found.  Procedures Procedures (including critical care time)  Medications Ordered in UC Medications - No data to display  Initial Impression / Assessment and Plan / UC Course  I have reviewed the triage vital signs and the nursing notes.  Pertinent labs & imaging results that were available during my care of the patient were reviewed by me and considered in my medical decision making (see chart for details).     Paxlovid sent as he is at increased risk for severe COVID disease. Covid swab done here at his employer's request. eGFR is nl when last done in Epic Final Clinical Impressions(s) / UC Diagnoses   Final diagnoses:  COVID     Discharge Instructions       You have been swabbed  for COVID, and the  test will result in the next 24 hours. Our staff will call you if positive. If the test is positive, you should quarantine for 5 days.  You have been swabbed for COVID, and the test will result in the next 24 hours. Our staff will call you if positive. If the test is positive, you should quarantine for 5 days.   Take paxlovid according to the package instructions     ED Prescriptions     Medication Sig Dispense Auth. Provider   nirmatrelvir/ritonavir EUA (PAXLOVID) 20 x 150 MG & 10 x 100MG TABS Take nirmatrelvir (150 mg) two tablets twice daily for 5 days and ritonavir (100 mg) one tablet twice daily for 5 days. 30 tablet Yoona Ishii, Gwenlyn Perking, MD      PDMP not reviewed this encounter.   Barrett Henle, MD 04/10/21 1840    Barrett Henle, MD 04/10/21 Ward Chatters    Barrett Henle, MD 04/10/21 [phone]

## 2021-04-10 NOTE — Discharge Instructions (Addendum)
°  You have been swabbed for COVID, and the test will result in the next 24 hours. Our staff will call you if positive. If the test is positive, you should quarantine for 5 days.  You have been swabbed for COVID, and the test will result in the next 24 hours. Our staff will call you if positive. If the test is positive, you should quarantine for 5 days.   Take paxlovid according to the package instructions

## 2021-04-10 NOTE — ED Triage Notes (Signed)
Pt c/o cough, body aches, nasal congestion, and headaches since Sunday night. States has had 2 positive home covid test yesterday. States took tylenol yesterday.

## 2021-04-11 LAB — SARS CORONAVIRUS 2 (TAT 6-24 HRS): SARS Coronavirus 2: POSITIVE — AB

## 2022-01-13 ENCOUNTER — Emergency Department (HOSPITAL_COMMUNITY)
Admission: EM | Admit: 2022-01-13 | Discharge: 2022-01-13 | Disposition: A | Discharge status: Home / Self Care | Payer: Self-pay | Attending: Emergency Medicine | Admitting: Emergency Medicine

## 2022-01-13 ENCOUNTER — Emergency Department (HOSPITAL_COMMUNITY): Payer: Self-pay

## 2022-01-13 ENCOUNTER — Other Ambulatory Visit: Payer: Self-pay

## 2022-01-13 ENCOUNTER — Encounter (HOSPITAL_COMMUNITY): Payer: Self-pay | Admitting: Emergency Medicine

## 2022-01-13 DIAGNOSIS — J209 Acute bronchitis, unspecified: Secondary | ICD-10-CM

## 2022-01-13 DIAGNOSIS — K219 Gastro-esophageal reflux disease without esophagitis: Secondary | ICD-10-CM | POA: Insufficient documentation

## 2022-01-13 DIAGNOSIS — R079 Chest pain, unspecified: Secondary | ICD-10-CM

## 2022-01-13 LAB — CBC
HCT: 49.7 % (ref 39.0–52.0)
Hemoglobin: 16 g/dL (ref 13.0–17.0)
MCH: 27.5 pg (ref 26.0–34.0)
MCHC: 32.2 g/dL (ref 30.0–36.0)
MCV: 85.5 fL (ref 80.0–100.0)
Platelets: 203 10*3/uL (ref 150–400)
RBC: 5.81 MIL/uL (ref 4.22–5.81)
RDW: 12.7 % (ref 11.5–15.5)
WBC: 4.4 10*3/uL (ref 4.0–10.5)
nRBC: 0 % (ref 0.0–0.2)

## 2022-01-13 LAB — BASIC METABOLIC PANEL
Anion gap: 7 (ref 5–15)
BUN: 12 mg/dL (ref 6–20)
CO2: 22 mmol/L (ref 22–32)
Calcium: 9.2 mg/dL (ref 8.9–10.3)
Chloride: 107 mmol/L (ref 98–111)
Creatinine, Ser: 0.82 mg/dL (ref 0.61–1.24)
GFR, Estimated: >60 mL/min (ref >60)
Glucose, Bld: 90 mg/dL (ref 70–99)
Potassium: 3.7 mmol/L (ref 3.5–5.1)
Sodium: 136 mmol/L (ref 135–145)

## 2022-01-13 LAB — TROPONIN I (HIGH SENSITIVITY)
Troponin I (High Sensitivity): 4 ng/L (ref <18)
Troponin I (High Sensitivity): 5 ng/L (ref <18)

## 2022-01-13 MED ORDER — PANTOPRAZOLE SODIUM 40 MG IV SOLR
40.0000 mg | Freq: Once | INTRAVENOUS | Status: AC
  Administered 2022-01-13: 40 mg via INTRAVENOUS
  Filled 2022-01-13: qty 10

## 2022-01-13 MED ORDER — PANTOPRAZOLE SODIUM 40 MG PO TBEC: 40.0000 mg | DELAYED_RELEASE_TABLET | Freq: Every day | ORAL | 0 refills | Status: DC

## 2022-01-13 MED ORDER — ALUM & MAG HYDROXIDE-SIMETH 200-200-20 MG/5ML PO SUSP
15.0000 mL | Freq: Once | ORAL | Status: AC
Start: 2022-01-13 — End: 2022-01-13
  Administered 2022-01-13: 15 mL via ORAL
  Filled 2022-01-13: qty 30

## 2022-01-13 NOTE — ED Triage Notes (Signed)
Feeling discomfort in his chest since yesterday, intermittent, reports pain 7/10, tried herbal tea w/o relief.   Denies SOB, denies N/V, feels some chest pressure.

## 2022-01-13 NOTE — ED Provider Notes (Signed)
[facility] DEPT Provider Note   CSN: [id number] Arrival date & time: 01/13/22  1346     History  Chief Complaint  Patient presents with   Chest Pain    Jeremy [name] is a 41 y.o. male.   Chest Pain Pain location:  L chest and substernal area Pain quality: pressure   Pain radiates to:  Does not radiate Pain severity:  Moderate Onset quality:  Gradual Duration:  1 day Progression:  Unchanged Chronicity:  New Context: at rest   Relieved by:  Nothing (tried herbal tea) Associated symptoms: no abdominal pain, no shortness of breath and no vomiting   Risk factors: no coronary artery disease, no diabetes mellitus, no hypertension and no smoking        Home Medications Prior to Admission medications   Medication Sig Start Date End Date Taking? Authorizing Provider  pantoprazole (PROTONIX) 40 MG tablet Take 1 tablet (40 mg total) by mouth daily. 01/13/22 02/12/22 Yes Dorie Rank, MD      Allergies    Patient has no known allergies.    Review of Systems   Review of Systems  Respiratory:  Negative for shortness of breath.   Cardiovascular:  Positive for chest pain.  Gastrointestinal:  Negative for abdominal pain and vomiting.    Physical Exam Updated Vital Signs BP 129/89   Pulse (!) 52   Temp 98.6 F (37 C)   Resp 15   Ht 1.803 m ('5\' 11"'$ )   Wt 96.2 kg   SpO2 100%   BMI 29.57 kg/m  Physical Exam  ED Results / Procedures / Treatments   Labs (all labs ordered are listed, but only abnormal results are displayed) Labs Reviewed  BASIC METABOLIC PANEL  CBC  TROPONIN I (HIGH SENSITIVITY)  TROPONIN I (HIGH SENSITIVITY)    EKG None  Radiology DG Chest 2 View  Result Date: 01/13/2022 CLINICAL DATA:  Intermittent chest pain since yesterday. EXAM: CHEST - 2 VIEW COMPARISON:  None Available. FINDINGS: Normal sized heart. Clear lungs with normal vascularity. Minimal thoracic spine degenerative changes. IMPRESSION: No acute  abnormality. Electronically Signed   By: Claudie Revering M.D.   On: 01/13/2022 14:50    Procedures Procedures    Medications Ordered in ED Medications  pantoprazole (PROTONIX) injection 40 mg (40 mg Intravenous Given 01/13/22 2019)  alum & mag hydroxide-simeth (MAALOX/MYLANTA) 200-200-20 MG/5ML suspension 15 mL (15 mLs Oral Given 01/13/22 2019)    ED Course/ Medical Decision Making/ A&P Clinical Course as of 01/13/22 2110  Sun Jan 13, 2022  1953 DG Chest 2 View nl [JK]  1953 CBC Nl  [JK]  0354 Basic metabolic panel nl [JK]  6568 Troponin I (High Sensitivity) nl [JK]    Clinical Course User Index [JK] Dorie Rank, MD           HEART Score: 0                Medical Decision Making DDX ACS, PNA, PTX, GERD  Problems Addressed: Chest pain, unspecified type: acute illness or injury Gastroesophageal reflux disease, unspecified whether esophagitis present: acute illness or injury  Amount and/or Complexity of Data Reviewed Labs: ordered. Decision-making details documented in ED Course. Radiology: ordered and independent interpretation performed. Decision-making details documented in ED Course.  Risk OTC drugs. Prescription drug management.   Patient presented to ED for evaluation of chest discomfort.  ED work-up reassuring.  Patient has low risk heart score.  Serial troponins normal.  Doubt acute coronary syndrome  or cardiac etiology.  No pneumonia or pneumothorax noted on x-ray.  Patient was given antacid and noted improvement in his symptoms.  Suspect gastroesophageal reflux.  Patient has been on antacids in the past.  Evaluation and diagnostic testing in the emergency department does not suggest an emergent condition requiring admission or immediate intervention beyond what has been performed at this time.  The patient is safe for discharge and has been instructed to return immediately for worsening symptoms, change in symptoms or any other concerns.       Final  Clinical Impression(s) / ED Diagnoses Final diagnoses:  Chest pain, unspecified type  Gastroesophageal reflux disease, unspecified whether esophagitis present    Rx / DC Orders ED Discharge Orders          Ordered    pantoprazole (PROTONIX) 40 MG tablet  Daily        01/13/22 2107              Dorie Rank, MD 01/13/22 2110

## 2022-01-13 NOTE — Discharge Instructions (Signed)
Take medication as prescribed to help with acid reflux.  Follow-up with a primary care doctor to be rechecked in about a week if the symptoms have not completely resolved.  Return to the ED as needed for worsening symptoms.

## 2022-01-13 NOTE — ED Provider Triage Note (Signed)
Emergency Medicine Provider Triage Evaluation Note  Jeremy [name] , a 41 y.o. male  was evaluated in triage.  Pt complains of left sided chest pain since yesterday  Review of Systems  Positive:  Negative:   Physical Exam  BP 134/88 (BP Location: Left Arm)   Pulse 63   Temp 98.6 F (37 C) (Oral)   Resp 18   Ht '5\' 11"'$  (1.803 m)   Wt 96.2 kg   SpO2 100%   BMI 29.57 kg/m  Gen:   Awake, no distress   Resp:  Normal effort  MSK:   Moves extremities without difficulty  Other:    Medical Decision Making  Medically screening exam initiated at 2:34 PM.  Appropriate orders placed.  Jeremy [name] was informed that the remainder of the evaluation will be completed by another provider, this initial triage assessment does not replace that evaluation, and the importance of remaining in the ED until their evaluation is complete.     Fransico Meadow, [place] 01/13/22 1435

## 2022-02-15 ENCOUNTER — Encounter (HOSPITAL_COMMUNITY): Payer: Self-pay | Admitting: Emergency Medicine

## 2022-02-15 ENCOUNTER — Ambulatory Visit (HOSPITAL_COMMUNITY)
Admission: EM | Admit: 2022-02-15 | Discharge: 2022-02-15 | Disposition: A | Discharge status: Home / Self Care | Payer: Self-pay | Attending: Nurse Practitioner | Admitting: Nurse Practitioner

## 2022-02-15 DIAGNOSIS — R509 Fever, unspecified: Secondary | ICD-10-CM

## 2022-02-15 DIAGNOSIS — J111 Influenza due to unidentified influenza virus with other respiratory manifestations: Secondary | ICD-10-CM

## 2022-02-15 LAB — POC INFLUENZA A AND B ANTIGEN (URGENT CARE ONLY)
INFLUENZA A ANTIGEN, POC: POSITIVE — AB
INFLUENZA B ANTIGEN, POC: NEGATIVE

## 2022-02-15 MED ORDER — IBUPROFEN 800 MG PO TABS
ORAL_TABLET | ORAL | Status: AC
  Filled 2022-02-15: qty 1

## 2022-02-15 MED ORDER — IBUPROFEN 800 MG PO TABS
800.0000 mg | ORAL_TABLET | Freq: Once | ORAL | Status: AC
  Administered 2022-02-15: 800 mg via ORAL

## 2022-02-15 MED ORDER — OSELTAMIVIR PHOSPHATE 75 MG PO CAPS: 75.0000 mg | ORAL_CAPSULE | Freq: Two times a day (BID) | ORAL | 0 refills | Status: AC

## 2022-02-15 NOTE — Discharge Instructions (Signed)
Tamiflu as prescribed Rest and fluids Ibuprofen and Tylenol as needed to control your fever Stay hydrated with water, Gatorade, Powerade, Pedialyte, ginger ale Follow-up with your PCP in 2 to 3 days for recheck Please go to the emergency room for any worsening symptoms I hope you feel better soon!

## 2022-02-15 NOTE — ED Triage Notes (Signed)
Pt reports body aches, chills, congestion, hiccups since yesterday. Took ibuprofen and mucinex, tylenol flu.

## 2022-02-15 NOTE — ED Provider Notes (Signed)
[name]    CSN: [id number] Arrival date & time: 02/15/22  1652      History   Chief Complaint Chief Complaint  Patient presents with   Generalized Body Aches   Nasal Congestion    HPI Jeremy [name] is a 41 y.o. male presents for evaluation of flulike symptoms.  Patient reports 1 day of fever, body aches, sore throat, cough, congestion.  Denies any nausea/vomiting/diarrhea, ear pain, shortness of breath.  No asthma or smoking history.  Is not sure if he is up-to-date on his COVID or flu vaccine.  Reports sick contacts via his coworkers.  He is taken Tylenol OTC.  No other concerns at this time.  HPI  Past Medical History:  Diagnosis Date   Allergy    Anxiety    GERD (gastroesophageal reflux disease)    Vertigo     Patient Active Problem List   Diagnosis Date Noted   GERD (gastroesophageal reflux disease) 08/04/2014    Past Surgical History:  Procedure Laterality Date   EYE SURGERY     FRACTURE SURGERY     NECK SURGERY     TONSILLECTOMY         Home Medications    Prior to Admission medications   Medication Sig Start Date End Date Taking? Authorizing Provider  oseltamivir (TAMIFLU) 75 MG capsule Take 1 capsule (75 mg total) by mouth every 12 (twelve) hours for 5 days. 02/15/22 02/20/22 Yes Melynda Ripple, NP  pantoprazole (PROTONIX) 40 MG tablet Take 1 tablet (40 mg total) by mouth daily. 01/13/22 02/12/22  Dorie Rank, MD    Family History Family History  Problem Relation Age of Onset   Thyroid disease Mother    Hyperlipidemia Mother    Stroke Father    Stroke Maternal Grandmother     Social History Social History   Tobacco Use   Smoking status: Never   Smokeless tobacco: Never  Substance Use Topics   Alcohol use: Yes    Alcohol/week: 1.0 - 2.0 standard drink of alcohol    Types: 1 - 2 Standard drinks or equivalent per week   Drug use: No     Allergies   Patient has no known allergies.   Review of Systems Review of Systems   Constitutional:  Positive for fever.  HENT:  Positive for congestion and sore throat.   Respiratory:  Positive for cough.   Musculoskeletal:  Positive for myalgias.     Physical Exam Triage Vital Signs ED Triage Vitals  Enc Vitals Group     BP 02/15/22 1812 118/80     Pulse Rate 02/15/22 1812 92     Resp 02/15/22 1812 20     Temp 02/15/22 1812 (!) 103 F (39.4 C)     Temp Source 02/15/22 1812 Oral     SpO2 02/15/22 1812 95 %     Weight --      Height --      Head Circumference --      Peak Flow --      Pain Score 02/15/22 1811 10     Pain Loc --      Pain Edu? --      Excl. in [place]? --    No data found.  Updated Vital Signs BP 118/80 (BP Location: Right Arm)   Pulse 92   Temp (!) 103 F (39.4 C) (Oral)   Resp 20   SpO2 95%   Visual Acuity Right Eye Distance:   Left Eye  Distance:   Bilateral Distance:    Right Eye Near:   Left Eye Near:    Bilateral Near:     Physical Exam Vitals and nursing note reviewed.  Constitutional:      General: He is not in acute distress.    Appearance: Normal appearance. He is not ill-appearing or toxic-appearing.  HENT:     Head: Normocephalic and atraumatic.     Right Ear: Tympanic membrane and ear canal normal.     Left Ear: Tympanic membrane and ear canal normal.     Nose: Congestion present.     Mouth/Throat:     Mouth: Mucous membranes are moist.     Pharynx: Posterior oropharyngeal erythema present.  Eyes:     Pupils: Pupils are equal, round, and reactive to light.  Cardiovascular:     Rate and Rhythm: Normal rate and regular rhythm.     Heart sounds: Normal heart sounds.  Pulmonary:     Effort: Pulmonary effort is normal.     Breath sounds: Normal breath sounds.  Musculoskeletal:     Cervical back: Normal range of motion and neck supple.  Lymphadenopathy:     Cervical: No cervical adenopathy.  Skin:    General: Skin is warm and dry.  Neurological:     General: No focal deficit present.     Mental Status: He  is alert and oriented to person, place, and time.  Psychiatric:        Mood and Affect: Mood normal.        Behavior: Behavior normal.      UC Treatments / Results  Labs (all labs ordered are listed, but only abnormal results are displayed) Labs Reviewed  POC INFLUENZA A AND B ANTIGEN (URGENT CARE ONLY) - Abnormal; Notable for the following components:      Result Value   INFLUENZA A ANTIGEN, POC POSITIVE (*)    All other components within normal limits    EKG   Radiology No results found.  Procedures Procedures (including critical care time)  Medications Ordered in UC Medications  ibuprofen (ADVIL) tablet 800 mg (800 mg Oral Given 02/15/22 1820)    Initial Impression / Assessment and Plan / UC Course  I have reviewed the triage vital signs and the nursing notes.  Pertinent labs & imaging results that were available during my care of the patient were reviewed by me and considered in my medical decision making (see chart for details).     Positive flu A in clinic, start Tamiflu Patient was given ibuprofen in clinic for fever and advised to alternate Tylenol ibuprofen for fever control Encourage fluids Rest Follow-up with PCP 2 to 3 days for recheck ER precautions reviewed and patient verbalized understanding Final Clinical Impressions(s) / UC Diagnoses   Final diagnoses:  Influenza  Fever, unspecified     Discharge Instructions      Tamiflu as prescribed Rest and fluids Ibuprofen and Tylenol as needed to control your fever Stay hydrated with water, Gatorade, Powerade, Pedialyte, ginger ale Follow-up with your PCP in 2 to 3 days for recheck Please go to the emergency room for any worsening symptoms I hope you feel better soon!    ED Prescriptions     Medication Sig Dispense Auth. Provider   oseltamivir (TAMIFLU) 75 MG capsule Take 1 capsule (75 mg total) by mouth every 12 (twelve) hours for 5 days. 10 capsule Melynda Ripple, NP      PDMP not  reviewed this encounter.  Melynda Ripple, NP 02/15/22 [phone]

## 2022-04-17 ENCOUNTER — Encounter (HOSPITAL_COMMUNITY): Payer: Self-pay

## 2022-04-17 ENCOUNTER — Ambulatory Visit (HOSPITAL_COMMUNITY)
Admission: EM | Admit: 2022-04-17 | Discharge: 2022-04-17 | Disposition: A | Discharge status: Home / Self Care | Payer: 59 | Attending: Family Medicine | Admitting: Family Medicine

## 2022-04-17 DIAGNOSIS — M25561 Pain in right knee: Secondary | ICD-10-CM | POA: Diagnosis not present

## 2022-04-17 MED ORDER — PREDNISONE 20 MG PO TABS: 40.0000 mg | ORAL_TABLET | Freq: Every day | ORAL | 0 refills | Status: DC

## 2022-04-17 MED ORDER — HYDROCODONE-ACETAMINOPHEN 5-325 MG PO TABS: 1.0000 | ORAL_TABLET | Freq: Four times a day (QID) | ORAL | 0 refills | Status: DC | PRN

## 2022-04-17 MED ORDER — DICLOFENAC SODIUM 75 MG PO TBEC: 75.0000 mg | DELAYED_RELEASE_TABLET | Freq: Two times a day (BID) | ORAL | 0 refills | Status: DC

## 2022-04-17 NOTE — ED Triage Notes (Signed)
Pt c/o rt knee pain x2wks. States last wk had swelling and went down with ice. Denies known injury but stands at work for [employer] days. Taking ibuprofen with some relief.

## 2022-04-17 NOTE — Discharge Instructions (Signed)
Be aware, you have been prescribed pain medications that may cause drowsiness. While taking this medication, do not take any other medications containing acetaminophen (Tylenol). Do not combine with alcohol or recreational drugs. Please do not drive, operate heavy machinery, or take part in activities that require making important decisions while on this medication as your judgement may be clouded.  

## 2022-04-20 NOTE — ED Provider Notes (Signed)
[facility]   YJ:[id number] 04/17/22 Arrival Time: [id number]  ASSESSMENT & PLAN:  1. Acute pain of right knee    Unclear etiology. Without trauma. No indication for plain imaging. Work not provided so that he'll have a few days off to rest knee.  Discharge Medication List as of 04/17/2022  8:24 PM     START taking these medications   Details  diclofenac (VOLTAREN) 75 MG EC tablet Take 1 tablet (75 mg total) by mouth 2 (two) times daily., Starting Wed 04/17/2022, Normal    HYDROcodone-acetaminophen (NORCO/VICODIN) 5-325 MG tablet Take 1 tablet by mouth every 6 (six) hours as needed for moderate pain or severe pain., Starting Wed 04/17/2022, Normal    predniSONE (DELTASONE) 20 MG tablet Take 2 tablets (40 mg total) by mouth daily., Starting Wed 04/17/2022, Normal        Recommend:  Follow-up Information     [facility], [facility].   Specialty: Specialist Why: If worsening or failing to improve as anticipated. Contact information: [place] [address] [phone]                Reviewed expectations re: course of current medical issues. Questions answered. Outlined signs and symptoms indicating need for more acute intervention. Patient verbalized understanding. After Visit Summary given.  SUBJECTIVE: History from: patient. Slaton Courter is a 42 y.o. male who reports R knee pain; x2wks. Gradual onset. States last wk had swelling and went down with ice. Denies known injury but stands at work for [employer] days.Taking ibuprofen with some relief. Denies knee trauma. Feels like knee occas gives out on him.   Past Surgical History:  Procedure Laterality Date   EYE SURGERY     FRACTURE SURGERY     NECK SURGERY     TONSILLECTOMY        OBJECTIVE:  Vitals:   04/17/22 1956  BP: (!) 145/98  Pulse: 66  Resp: 18  Temp: 98.6 F (37 C)  TempSrc: Oral  SpO2: 97%    General appearance: alert; no distress HEENT: [place]; AT Neck: supple with FROM Resp:  unlabored respirations Extremities: RLE: warm with well perfused appearance; poorly localized mild tenderness over right anterior/lateral knee; without gross deformities; swelling: none; bruising: none; knee ROM: normal CV: brisk extremity capillary refill of RLE; 2+DP of RLE. Skin: warm and dry; no visible rashes Neurologic: gait normal; normal sensation and strength of RLE Psychological: alert and cooperative; normal mood and affect    No Known Allergies  Past Medical History:  Diagnosis Date   Allergy    Anxiety    GERD (gastroesophageal reflux disease)    Vertigo    Social History   Socioeconomic History   Marital status: Significant Other    Spouse name: Not on file   Number of children: Not on file   Years of education: Not on file   Highest education level: Not on file  Occupational History   Not on file  Tobacco Use   Smoking status: Never   Smokeless tobacco: Never  Substance and Sexual Activity   Alcohol use: Yes    Alcohol/week: 1.0 - 2.0 standard drink of alcohol    Types: 1 - 2 Standard drinks or equivalent per week   Drug use: No   Sexual activity: Yes  Other Topics Concern   Not on file  Social History Narrative   Not on file   Social Determinants of Health   Financial Resource Strain: Not on file  Food  Insecurity: Not on file  Transportation Needs: Not on file  Physical Activity: Not on file  Stress: Not on file  Social Connections: Not on file   Family History  Problem Relation Age of Onset   Thyroid disease Mother    Hyperlipidemia Mother    Stroke Father    Stroke Maternal Grandmother    Past Surgical History:  Procedure Laterality Date   [place]         Vanessa Kick, MD 04/20/22 1045

## 2022-05-25 ENCOUNTER — Ambulatory Visit (HOSPITAL_COMMUNITY)
Admission: EM | Admit: 2022-05-25 | Discharge: 2022-05-25 | Disposition: A | Discharge status: Home / Self Care | Payer: 59 | Attending: Emergency Medicine | Admitting: Emergency Medicine

## 2022-05-25 ENCOUNTER — Encounter (HOSPITAL_COMMUNITY): Payer: Self-pay

## 2022-05-25 DIAGNOSIS — M7662 Achilles tendinitis, left leg: Secondary | ICD-10-CM

## 2022-05-25 DIAGNOSIS — M7661 Achilles tendinitis, right leg: Secondary | ICD-10-CM | POA: Diagnosis not present

## 2022-05-25 MED ORDER — NAPROXEN 500 MG PO TABS: 500.0000 mg | ORAL_TABLET | Freq: Two times a day (BID) | ORAL | 0 refills | Status: DC

## 2022-05-25 NOTE — ED Triage Notes (Signed)
Patient c/o bilateral right heel pain/achilles tendon area and when he woke today he noted bilateral swelling. Patient states he has been going up and down 4 flights of stairs frequently at his job.  Patient states he took Ibuprofen yesterday for his pain.

## 2022-05-25 NOTE — ED Provider Notes (Addendum)
[place]    CSN: [id number] Arrival date & time: 05/25/22  1624      History   Chief Complaint Chief Complaint  Patient presents with   Ankle Pain    HPI Jeremy [name] is a 42 y.o. male.   Reports he woke up at 4am and both of his ankles were slightly swollen.   At his jobs he walks up and down multiple flights of stairs. Yesterday he walked up and down for 10 hours, had bilateral posterior heel pain.   Has elevated his feet at home, has been resting all day. Was referred to [facility] as well.    Ankle Pain Associated symptoms: no back pain and no fever     Past Medical History:  Diagnosis Date   Allergy    Anxiety    GERD (gastroesophageal reflux disease)    Vertigo     Patient Active Problem List   Diagnosis Date Noted   GERD (gastroesophageal reflux disease) 08/04/2014    Past Surgical History:  Procedure Laterality Date   EYE SURGERY     FRACTURE SURGERY     NECK SURGERY     TONSILLECTOMY         Home Medications    Prior to Admission medications   Medication Sig Start Date End Date Taking? Authorizing Provider  naproxen (NAPROSYN) 500 MG tablet Take 1 tablet (500 mg total) by mouth 2 (two) times daily. 05/25/22  Yes Louretta Shorten, [place], FNP  diclofenac (VOLTAREN) 75 MG EC tablet Take 1 tablet (75 mg total) by mouth 2 (two) times daily. 04/17/22   Vanessa Kick, MD    Family History Family History  Problem Relation Age of Onset   Thyroid disease Mother    Hyperlipidemia Mother    Stroke Father    Stroke Maternal Grandmother     Social History Social History   Tobacco Use   Smoking status: Never   Smokeless tobacco: Never  Vaping Use   Vaping Use: Never used  Substance Use Topics   Alcohol use: Yes    Alcohol/week: 1.0 - 2.0 standard drink of alcohol    Types: 1 - 2 Standard drinks or equivalent per week   Drug use: No     Allergies   Patient has no known allergies.   Review of Systems Review of Systems   Constitutional:  Negative for chills and fever.  HENT:  Negative for ear pain and sore throat.   Eyes:  Negative for pain and visual disturbance.  Respiratory:  Negative for cough and shortness of breath.   Cardiovascular:  Negative for chest pain and palpitations.  Gastrointestinal:  Negative for abdominal pain and vomiting.  Genitourinary:  Negative for dysuria and hematuria.  Musculoskeletal:  Positive for arthralgias and joint swelling. Negative for back pain.  Skin:  Negative for color change and rash.  Neurological:  Negative for seizures and syncope.  All other systems reviewed and are negative.    Physical Exam Triage Vital Signs ED Triage Vitals [05/25/22 1657]  Enc Vitals Group     BP 123/79     Pulse Rate 65     Resp 14     Temp 98.5 F (36.9 C)     Temp Source Oral     SpO2 97 %     Weight      Height      Head Circumference      Peak Flow      Pain Score  Pain Loc      Pain Edu?      Excl. in [place]?    No data found.  Updated Vital Signs BP 123/79 (BP Location: Left Arm)   Pulse 65   Temp 98.5 F (36.9 C) (Oral)   Resp 14   SpO2 97%   Visual Acuity Right Eye Distance:   Left Eye Distance:   Bilateral Distance:    Right Eye Near:   Left Eye Near:    Bilateral Near:     Physical Exam Vitals and nursing note reviewed.  Constitutional:      General: He is not in acute distress.    Appearance: He is well-developed.  HENT:     Head: Normocephalic and atraumatic.  Eyes:     General: Lids are normal.     Conjunctiva/sclera: Conjunctivae normal.  Cardiovascular:     Rate and Rhythm: Normal rate and regular rhythm.  Pulmonary:     Effort: Pulmonary effort is normal. No respiratory distress.     Breath sounds: Normal breath sounds.  Musculoskeletal:        General: Tenderness present. No swelling, deformity or signs of injury. Normal range of motion.     Cervical back: Neck supple.     Right lower leg: No edema.     Left lower leg: No  edema.     Comments: Thompson test intact bilaterally.   Skin:    General: Skin is warm and dry.     Capillary Refill: Capillary refill takes less than 2 seconds.  Neurological:     Mental Status: He is alert and oriented to person, place, and time.  Psychiatric:        Mood and Affect: Mood normal.        Behavior: Behavior is cooperative.      UC Treatments / Results  Labs (all labs ordered are listed, but only abnormal results are displayed) Labs Reviewed - No data to display  EKG   Radiology No results found.  Procedures Procedures (including critical care time)  Medications Ordered in UC Medications - No data to display  Initial Impression / Assessment and Plan / UC Course  I have reviewed the triage vital signs and the nursing notes.  Pertinent labs & imaging results that were available during my care of the patient were reviewed by me and considered in my medical decision making (see chart for details).  Vitals in triage reviewed, patient is hemodynamically stable.  Bilateral Achilles pain with palpation, no obvious swelling or deformity.  No injury.  Thompson test negative bilaterally. Discussed this is likely Achilles tendinopathy.  Anti-inflammatory, rest, ice and gentle stretching exercises discussed.  Encouraged follow-up with orthopedic.  Patient verbalized understanding of treatment plan, no questions at this time.    Final Clinical Impressions(s) / UC Diagnoses   Final diagnoses:  Achilles tendinitis of both lower extremities     Discharge Instructions      Your examination was consistent with Achilles tendinitis of both extremities.  Please take the naproxen as scheduled, do not take this with any other NSAIDs like ibuprofen and Diflucan or Mobic.  Please continue your stretching, the resistance band is a good idea.  Please get some new shoes and some inserts for the shoes like Dr. Felicie Morn to help assist with proper cushioning.  Over the weekend,  please rest, ice and elevate your Achilles.  Please follow-up with [facility], as they will be able to further evaluate you and might be  able to offer you alternative treatment options.      ED Prescriptions     Medication Sig Dispense Auth. Provider   naproxen (NAPROSYN) 500 MG tablet Take 1 tablet (500 mg total) by mouth 2 (two) times daily. 30 tablet Sie Formisano, [place], [place]      PDMP not reviewed this encounter.   Kapil Petropoulos, [place], [place] 05/25/22 [place], [place], [place] 05/25/22 1729

## 2022-05-25 NOTE — Discharge Instructions (Addendum)
Your examination was consistent with Achilles tendinitis of both extremities.  Please take the naproxen as scheduled, do not take this with any other NSAIDs like ibuprofen and Diflucan or Mobic.  Please continue your stretching, the resistance band is a good idea.  Please get some new shoes and some inserts for the shoes like Dr. Felicie Morn to help assist with proper cushioning.  Over the weekend, please rest, ice and elevate your Achilles.  Please follow-up with [facility], as they will be able to further evaluate you and might be able to offer you alternative treatment options.

## 2022-07-01 ENCOUNTER — Encounter (HOSPITAL_COMMUNITY): Payer: Self-pay | Admitting: Emergency Medicine

## 2022-07-01 ENCOUNTER — Ambulatory Visit (HOSPITAL_COMMUNITY)
Admission: EM | Admit: 2022-07-01 | Discharge: 2022-07-01 | Disposition: A | Discharge status: Home / Self Care | Payer: 59 | Attending: Physician Assistant | Admitting: Physician Assistant

## 2022-07-01 DIAGNOSIS — M62830 Muscle spasm of back: Secondary | ICD-10-CM | POA: Diagnosis not present

## 2022-07-01 MED ORDER — CYCLOBENZAPRINE HCL 10 MG PO TABS: 10.0000 mg | ORAL_TABLET | Freq: Three times a day (TID) | ORAL | 0 refills | Status: AC | PRN

## 2022-07-01 NOTE — ED Provider Notes (Signed)
[facility]    CSN: [id number] Arrival date & time: 07/01/22  1710      History   Chief Complaint No chief complaint on file.   HPI Jeremy [name] is a 42 y.o. male.   Patient complains of right lower back pain that started several days ago.  He reports pain is likely associated with his job as he is required to lift heavy boxes overhead.  He denies known injury or trauma.  He denies radiation of pain, numbness, tingling, saddle anesthesia.  He has tried ibuprofen with some relief but temporary.  He reports pain is worse with movement.  He returned to work today and reports he was unable to lift boxes due to pain.    Past Medical History:  Diagnosis Date   Allergy    Anxiety    GERD (gastroesophageal reflux disease)    Vertigo     Patient Active Problem List   Diagnosis Date Noted   GERD (gastroesophageal reflux disease) 08/04/2014    Past Surgical History:  Procedure Laterality Date   EYE SURGERY     FRACTURE SURGERY     NECK SURGERY     TONSILLECTOMY         Home Medications    Prior to Admission medications   Medication Sig Start Date End Date Taking? Authorizing Provider  cyclobenzaprine (FLEXERIL) 10 MG tablet Take 1 tablet (10 mg total) by mouth 3 (three) times daily as needed for up to 10 days for muscle spasms. 07/01/22 07/11/22 Yes Ward, Tylene Fantasia, PA-C  diclofenac (VOLTAREN) 75 MG EC tablet Take 1 tablet (75 mg total) by mouth 2 (two) times daily. 04/17/22   Mardella Layman, MD  naproxen (NAPROSYN) 500 MG tablet Take 1 tablet (500 mg total) by mouth 2 (two) times daily. 05/25/22   [place], [place] N, FNP    Family History Family History  Problem Relation Age of Onset   Thyroid disease Mother    Hyperlipidemia Mother    Stroke Father    Stroke Maternal Grandmother     Social History Social History   Tobacco Use   Smoking status: Never   Smokeless tobacco: Never  Vaping Use   Vaping Use: Never used  Substance Use Topics   Alcohol  use: Yes    Alcohol/week: 1.0 - 2.0 standard drink of alcohol    Types: 1 - 2 Standard drinks or equivalent per week   Drug use: No     Allergies   Patient has no known allergies.   Review of Systems Review of Systems  Constitutional:  Negative for chills and fever.  HENT:  Negative for ear pain and sore throat.   Eyes:  Negative for pain and visual disturbance.  Respiratory:  Negative for cough and shortness of breath.   Cardiovascular:  Negative for chest pain and palpitations.  Gastrointestinal:  Negative for abdominal pain and vomiting.  Genitourinary:  Negative for dysuria and hematuria.  Musculoskeletal:  Positive for back pain. Negative for arthralgias.  Skin:  Negative for color change and rash.  Neurological:  Negative for seizures and syncope.  All other systems reviewed and are negative.    Physical Exam Triage Vital Signs ED Triage Vitals [07/01/22 1834]  Enc Vitals Group     BP 132/77     Pulse Rate 68     Resp 15     Temp 98.1 F (36.7 C)     Temp Source Oral     SpO2 98 %  Weight      Height      Head Circumference      Peak Flow      Pain Score 9     Pain Loc      Pain Edu?      Excl. in [place]?    No data found.  Updated Vital Signs BP 132/77 (BP Location: Left Arm)   Pulse 68   Temp 98.1 F (36.7 C) (Oral)   Resp 15   SpO2 98%   Visual Acuity Right Eye Distance:   Left Eye Distance:   Bilateral Distance:    Right Eye Near:   Left Eye Near:    Bilateral Near:     Physical Exam Vitals and nursing note reviewed.  Constitutional:      General: He is not in acute distress.    Appearance: He is well-developed.  HENT:     Head: Normocephalic and atraumatic.  Eyes:     Conjunctiva/sclera: Conjunctivae normal.  Cardiovascular:     Rate and Rhythm: Normal rate and regular rhythm.     Heart sounds: No murmur heard. Pulmonary:     Effort: Pulmonary effort is normal. No respiratory distress.     Breath sounds: Normal breath sounds.   Abdominal:     Palpations: Abdomen is soft.     Tenderness: There is no abdominal tenderness.  Musculoskeletal:        General: No swelling.     Cervical back: Neck supple.     Comments: Negative straight leg raise.  Mild tenderness to palpation to right lumbar paraspinal musculature.  Normal strength normal range of motion.  Skin:    General: Skin is warm and dry.     Capillary Refill: Capillary refill takes less than 2 seconds.  Neurological:     Mental Status: He is alert.  Psychiatric:        Mood and Affect: Mood normal.      UC Treatments / Results  Labs (all labs ordered are listed, but only abnormal results are displayed) Labs Reviewed - No data to display  EKG   Radiology No results found.  Procedures Procedures (including critical care time)  Medications Ordered in UC Medications - No data to display  Initial Impression / Assessment and Plan / UC Course  I have reviewed the triage vital signs and the nursing notes.  Pertinent labs & imaging results that were available during my care of the patient were reviewed by me and considered in my medical decision making (see chart for details).     Lumbar muscle spasm.  Flexeril prescribed.  Supportive care discussed.  Return precautions discussed.  No red flags in clinic today. Final Clinical Impressions(s) / UC Diagnoses   Final diagnoses:  Back spasm     Discharge Instructions      Take Flexeril as needed for muscle spasm. Recommend 600 mg ibuprofen every 8 hours as needed. Can continue with w heating pad pad as needed. Recommend gentle stretching.   ED Prescriptions     Medication Sig Dispense Auth. Provider   cyclobenzaprine (FLEXERIL) 10 MG tablet Take 1 tablet (10 mg total) by mouth 3 (three) times daily as needed for up to 10 days for muscle spasms. 30 tablet Ward, Tylene Fantasia, PA-C      PDMP not reviewed this encounter.   Ward, Tylene Fantasia, PA-C 07/01/22 [phone]

## 2022-07-01 NOTE — ED Triage Notes (Signed)
Pt c/o back pain on right side that started Saturday. Reports thought related to work due to lots of lifting.  Tried to go to work today but pain in back too bad.  Also felt two knots on back that would like looked at.

## 2022-07-01 NOTE — Discharge Instructions (Addendum)
Take Flexeril as needed for muscle spasm. Recommend 600 mg ibuprofen every 8 hours as needed. Can continue with w heating pad pad as needed. Recommend gentle stretching.

## 2022-08-06 ENCOUNTER — Ambulatory Visit (HOSPITAL_COMMUNITY)
Admission: EM | Admit: 2022-08-06 | Discharge: 2022-08-06 | Disposition: A | Discharge status: Home / Self Care | Payer: 59 | Attending: Emergency Medicine | Admitting: Emergency Medicine

## 2022-08-06 ENCOUNTER — Encounter (HOSPITAL_COMMUNITY): Payer: Self-pay | Admitting: *Deleted

## 2022-08-06 DIAGNOSIS — R42 Dizziness and giddiness: Secondary | ICD-10-CM

## 2022-08-06 DIAGNOSIS — M545 Low back pain, unspecified: Secondary | ICD-10-CM | POA: Diagnosis not present

## 2022-08-06 MED ORDER — LIDOCAINE 5 % EX PTCH: 1.0000 | MEDICATED_PATCH | CUTANEOUS | 0 refills | Status: DC

## 2022-08-06 MED ORDER — CYCLOBENZAPRINE HCL 10 MG PO TABS: 10.0000 mg | ORAL_TABLET | Freq: Two times a day (BID) | ORAL | 0 refills | Status: DC | PRN

## 2022-08-06 MED ORDER — IBUPROFEN 600 MG PO TABS: 600.0000 mg | ORAL_TABLET | Freq: Three times a day (TID) | ORAL | 0 refills | Status: DC

## 2022-08-06 NOTE — Discharge Instructions (Addendum)
You can take the muscle relaxer twice daily. If the medication makes you drowsy, take only at bed time.  Ibuprofen and/or tylenol every 4-6 hours  Lidocaine patch can be applied for 12 hours at a time  Avoid heavy lifting and strenuous activity  It may take several days to a week for symptoms to improve.  Please follow up with your new primary care provider! Continue drinking lots of fluids to stay hydrated, and monitor for any change in symptoms.

## 2022-08-06 NOTE — ED Triage Notes (Signed)
Pt states he has been dizzy on and off x 3 weeks, he states it worse when he makes quick movements.   Pt states he has lower back pain X 1 week. He states he hasn't taken any meds

## 2022-08-06 NOTE — ED Provider Notes (Signed)
[facility]    CSN: [id number] Arrival date & time: 08/06/22  1734      History   Chief Complaint Chief Complaint  Patient presents with   Dizziness   Back Pain    HPI Jeremy [name] is a 42 y.o. male.  Here with 1 week history of bilateral low back pain Does not radiate. No bowel or bladder dysfunction. No fever. Denies known trauma or injury Was seen for back spasm last month, thought it was due to heavy lifting at his job  Also dizziness on and off for 3 weeks. Mostly occurs with moving the head too quickly. No head injury. Denies headache. No vision changes, neck pain or stiffness, falls, weakness Reports this happened to him a few times several years ago but stopped on its own, never saw a provider about this Not currently having any dizziness  Has not taken any medications for symptoms   Does not have a PCP   Past Medical History:  Diagnosis Date   Allergy    Anxiety    GERD (gastroesophageal reflux disease)    Vertigo     Patient Active Problem List   Diagnosis Date Noted   GERD (gastroesophageal reflux disease) 08/04/2014    Past Surgical History:  Procedure Laterality Date   EYE SURGERY     FRACTURE SURGERY     NECK SURGERY     TONSILLECTOMY         Home Medications    Prior to Admission medications   Medication Sig Start Date End Date Taking? Authorizing Provider  cyclobenzaprine (FLEXERIL) 10 MG tablet Take 1 tablet (10 mg total) by mouth 2 (two) times daily as needed for muscle spasms. 08/06/22  Yes Shawntina Diffee, Lurena Joiner, PA-C  ibuprofen (ADVIL) 600 MG tablet Take 1 tablet (600 mg total) by mouth 3 (three) times daily. 08/06/22  Yes Jamya Starry, PA-C  lidocaine (LIDODERM) 5 % Place 1 patch onto the skin daily. Remove & Discard patch within 12 hours 08/06/22  Yes Ruddy Swire, Lurena Joiner, PA-C    Family History Family History  Problem Relation Age of Onset   Thyroid disease Mother    Hyperlipidemia Mother    Stroke Father    Stroke  Maternal Grandmother     Social History Social History   Tobacco Use   Smoking status: Never   Smokeless tobacco: Never  Vaping Use   Vaping Use: Never used  Substance Use Topics   Alcohol use: Yes    Alcohol/week: 1.0 - 2.0 standard drink of alcohol    Types: 1 - 2 Standard drinks or equivalent per week   Drug use: No     Allergies   Patient has no known allergies.   Review of Systems Review of Systems As per HPI  Physical Exam Triage Vital Signs ED Triage Vitals  Enc Vitals Group     BP 08/06/22 1850 109/74     Pulse Rate 08/06/22 1850 63     Resp 08/06/22 1850 18     Temp 08/06/22 1850 98.9 F (37.2 C)     Temp Source 08/06/22 1850 Oral     SpO2 08/06/22 1850 96 %     Weight --      Height --      Head Circumference --      Peak Flow --      Pain Score 08/06/22 1849 7     Pain Loc --      Pain Edu? --  Excl. in [place]? --    No data found.  Updated Vital Signs BP 109/74 (BP Location: Left Arm)   Pulse 63   Temp 98.9 F (37.2 C) (Oral)   Resp 18   SpO2 96%    Physical Exam Vitals and nursing note reviewed.  Constitutional:      General: He is not in acute distress. HENT:     Nose: Nose normal.     Mouth/Throat:     Mouth: Mucous membranes are moist.     Pharynx: Oropharynx is clear.  Eyes:     Extraocular Movements: Extraocular movements intact.     Conjunctiva/sclera: Conjunctivae normal.     Pupils: Pupils are equal, round, and reactive to light.  Cardiovascular:     Rate and Rhythm: Normal rate and regular rhythm.     Heart sounds: Normal heart sounds.  Pulmonary:     Effort: Pulmonary effort is normal.     Breath sounds: Normal breath sounds.  Abdominal:     Palpations: Abdomen is soft.     Tenderness: There is no abdominal tenderness.  Musculoskeletal:        General: Normal range of motion.     Lumbar back: Negative right straight leg raise test and negative left straight leg raise test.     Comments: No spinal tenderness C-L  spine. No muscular tenderness. Good ROM of neck, back, extremities   Skin:    General: Skin is warm and dry.  Neurological:     Mental Status: He is alert and oriented to person, place, and time.     Cranial Nerves: Cranial nerves 2-12 are intact.     Sensory: Sensation is intact.     Motor: Motor function is intact. No weakness or pronator drift.     Coordination: Coordination is intact. Romberg sign negative.     Gait: Gait is intact.     Deep Tendon Reflexes: Reflexes are normal and symmetric.     Comments: Strength 5/5 all extremities, sensation intact.      UC Treatments / Results  Labs (all labs ordered are listed, but only abnormal results are displayed) Labs Reviewed - No data to display  EKG  Radiology No results found.  Procedures Procedures (including critical care time)  Medications Ordered in UC Medications - No data to display  Initial Impression / Assessment and Plan / UC Course  I have reviewed the triage vital signs and the nursing notes.  Pertinent labs & imaging results that were available during my care of the patient were reviewed by me and considered in my medical decision making (see chart for details).  Afebrile, stable vitals. Well appearing. No red flags. Neuro exam intact. Suspect back pain muscular. Symptomatic care with ibuprofen, tylenol, flexeril BID prn, lidocaine patch, rest Set up with PCP, appointment in 2 weeks. Advised to follow up regarding back pain and vertigo episodes. Strict return and ED precautions in the meantime Work note provided  Final Clinical Impressions(s) / UC Diagnoses   Final diagnoses:  Acute bilateral low back pain without sciatica  Vertigo     Discharge Instructions      You can take the muscle relaxer twice daily. If the medication makes you drowsy, take only at bed time.  Ibuprofen and/or tylenol every 4-6 hours  Lidocaine patch can be applied for 12 hours at a time  Avoid heavy lifting and strenuous  activity  It may take several days to a week for symptoms to improve.  Please follow up with your new primary care provider! Continue drinking lots of fluids to stay hydrated, and monitor for any change in symptoms.     ED Prescriptions     Medication Sig Dispense Auth. Provider   cyclobenzaprine (FLEXERIL) 10 MG tablet Take 1 tablet (10 mg total) by mouth 2 (two) times daily as needed for muscle spasms. 20 tablet Jhalen Eley, PA-C   lidocaine (LIDODERM) 5 % Place 1 patch onto the skin daily. Remove & Discard patch within 12 hours 14 patch Baani Bober, PA-C   ibuprofen (ADVIL) 600 MG tablet Take 1 tablet (600 mg total) by mouth 3 (three) times daily. 30 tablet Kieanna Rollo, Lurena Joiner, PA-C      PDMP not reviewed this encounter.   Marlow Baars, [place] 08/06/22 1947

## 2022-08-22 ENCOUNTER — Ambulatory Visit: Payer: 59 | Admitting: Family

## 2022-09-18 ENCOUNTER — Ambulatory Visit (HOSPITAL_COMMUNITY)
Admission: EM | Admit: 2022-09-18 | Discharge: 2022-09-18 | Disposition: A | Discharge status: Home / Self Care | Payer: 59 | Attending: Family Medicine | Admitting: Family Medicine

## 2022-09-18 ENCOUNTER — Encounter (HOSPITAL_COMMUNITY): Payer: Self-pay | Admitting: Emergency Medicine

## 2022-09-18 DIAGNOSIS — R319 Hematuria, unspecified: Secondary | ICD-10-CM | POA: Insufficient documentation

## 2022-09-18 LAB — CBC
HCT: 48 % (ref 39.0–52.0)
Hemoglobin: 15.9 g/dL (ref 13.0–17.0)
MCH: 26.9 pg (ref 26.0–34.0)
MCHC: 33.1 g/dL (ref 30.0–36.0)
MCV: 81.1 fL (ref 80.0–100.0)
Platelets: 200 10*3/uL (ref 150–400)
RBC: 5.92 MIL/uL — ABNORMAL HIGH (ref 4.22–5.81)
RDW: 12.6 % (ref 11.5–15.5)
WBC: 5 10*3/uL (ref 4.0–10.5)
nRBC: 0 % (ref 0.0–0.2)

## 2022-09-18 LAB — BASIC METABOLIC PANEL
Anion gap: 14 (ref 5–15)
BUN: 8 mg/dL (ref 6–20)
CO2: 23 mmol/L (ref 22–32)
Calcium: 9.6 mg/dL (ref 8.9–10.3)
Chloride: 103 mmol/L (ref 98–111)
Creatinine, Ser: 0.89 mg/dL (ref 0.61–1.24)
GFR, Estimated: >60 mL/min (ref >60)
Glucose, Bld: 78 mg/dL (ref 70–99)
Potassium: 3.9 mmol/L (ref 3.5–5.1)
Sodium: 140 mmol/L (ref 135–145)

## 2022-09-18 LAB — POCT URINALYSIS DIP (MANUAL ENTRY)
Bilirubin, UA: NEGATIVE
Glucose, UA: NEGATIVE mg/dL
Ketones, POC UA: NEGATIVE mg/dL
Leukocytes, UA: NEGATIVE
Nitrite, UA: NEGATIVE
Protein Ur, POC: NEGATIVE mg/dL
Spec Grav, UA: >=1.03 — AB (ref 1.010–1.025)
Urobilinogen, UA: 0.2 E.U./dL
pH, UA: 5.5 (ref 5.0–8.0)

## 2022-09-18 NOTE — ED Triage Notes (Signed)
Having lower abd pains for 4-5 days. Pain is worse before empties bladder. Today saw blood in urine.

## 2022-09-18 NOTE — Discharge Instructions (Addendum)
You have had labs (blood tests) sent today. We will call you with any significant abnormalities or if there is need to begin or change treatment or pursue further follow up.  You may also review your test results online through MyChart. If you do not have a MyChart account, instructions to sign up should be on your discharge paperwork.  

## 2022-09-19 NOTE — ED Provider Notes (Signed)
[facility]   [id number] 09/18/22 Arrival Time: 1728  ASSESSMENT & PLAN:  1. Hematuria, unspecified type    Benign abdominal exam. Unclear etiology of hematuria. Past smoker (THC).  Orders Placed This Encounter   Ambulatory referral to Urology    Referral Priority:   Routine    Referral Type:   Consultation    Referral Reason:   Specialty Services Required    Referred to Provider:   Jerald Kief, MD    Requested Specialty:   Urology    Number of Visits Requested:   1   No significant lab abnormalities.  Results for orders placed or performed during the hospital encounter of 09/18/22  Basic metabolic panel  Result Value Ref Range   Sodium 140 135 - 145 mmol/L   Potassium 3.9 3.5 - 5.1 mmol/L   Chloride 103 98 - 111 mmol/L   CO2 23 22 - 32 mmol/L   Glucose, Bld 78 70 - 99 mg/dL   BUN 8 6 - 20 mg/dL   Creatinine, Ser 4.09 0.61 - 1.24 mg/dL   Calcium 9.6 8.9 - 81.1 mg/dL   GFR, Estimated >91 >47 mL/min   Anion gap 14 5 - 15  CBC  Result Value Ref Range   WBC 5.0 4.0 - 10.5 K/uL   RBC 5.92 (H) 4.22 - 5.81 MIL/uL   Hemoglobin 15.9 13.0 - 17.0 g/dL   HCT 82.9 56.2 - 13.0 %   MCV 81.1 80.0 - 100.0 fL   MCH 26.9 26.0 - 34.0 pg   MCHC 33.1 30.0 - 36.0 g/dL   RDW 86.5 78.4 - 69.6 %   Platelets 200 150 - 400 K/uL   nRBC 0.0 0.0 - 0.2 %  POC urinalysis dipstick  Result Value Ref Range   Color, UA yellow yellow   Clarity, UA clear clear   Glucose, UA negative negative mg/dL   Bilirubin, UA negative negative   Ketones, POC UA negative negative mg/dL   Spec Grav, UA >=2.952 (A) 1.010 - 1.025   Blood, UA trace-intact (A) negative   pH, UA 5.5 5.0 - 8.0   Protein Ur, POC negative negative mg/dL   Urobilinogen, UA 0.2 0.2 or 1.0 E.U./dL   Nitrite, UA Negative Negative   Leukocytes, UA Negative Negative    Reviewed expectations re: course of current medical issues. Questions answered. Outlined signs and symptoms indicating need for more acute  intervention. Patient verbalized understanding. After Visit Summary given.   SUBJECTIVE: History from: patient. Jeremy [name] is a 42 y.o. male who presents with complaint lower abd discomfort (around suprapubic area and only at night) for 4-5 days. Pain is worse before empties bladder. Today saw bright red blood in urine x 1. Denies fever/n/v. Denies abd/pelvic injury. No h/o similar. Denies dysuria or urinary frequency. Denies worries over STI.  Distant THC smoker.  Past Surgical History:  Procedure Laterality Date   EYE SURGERY     FRACTURE SURGERY     NECK SURGERY     TONSILLECTOMY      OBJECTIVE:  Vitals:   09/18/22 1756  BP: 123/73  Pulse: 73  Resp: 16  Temp: 98.5 F (36.9 C)  TempSrc: Oral  SpO2: 97%    General appearance: alert, oriented, no acute distress HEENT: [place]; AT; oropharynx moist Lungs: unlabored respirations Abdomen: soft; without distention; no specific tenderness to palpation; normal bowel sounds; without masses or organomegaly; without guarding or rebound tenderness Back: without reported CVA tenderness; FROM at waist Extremities: without LE  edema; symmetrical; without gross deformities Skin: warm and dry Neurologic: normal gait Psychological: alert and cooperative; normal mood and affect  Labs: Results for orders placed or performed during the hospital encounter of 09/18/22  Basic metabolic panel  Result Value Ref Range   Sodium 140 135 - 145 mmol/L   Potassium 3.9 3.5 - 5.1 mmol/L   Chloride 103 98 - 111 mmol/L   CO2 23 22 - 32 mmol/L   Glucose, Bld 78 70 - 99 mg/dL   BUN 8 6 - 20 mg/dL   Creatinine, Ser 2.95 0.61 - 1.24 mg/dL   Calcium 9.6 8.9 - 62.1 mg/dL   GFR, Estimated >30 >86 mL/min   Anion gap 14 5 - 15  CBC  Result Value Ref Range   WBC 5.0 4.0 - 10.5 K/uL   RBC 5.92 (H) 4.22 - 5.81 MIL/uL   Hemoglobin 15.9 13.0 - 17.0 g/dL   HCT 57.8 46.9 - 62.9 %   MCV 81.1 80.0 - 100.0 fL   MCH 26.9 26.0 - 34.0 pg   MCHC 33.1 30.0 - 36.0  g/dL   RDW 52.8 41.3 - 24.4 %   Platelets 200 150 - 400 K/uL   nRBC 0.0 0.0 - 0.2 %  POC urinalysis dipstick  Result Value Ref Range   Color, UA yellow yellow   Clarity, UA clear clear   Glucose, UA negative negative mg/dL   Bilirubin, UA negative negative   Ketones, POC UA negative negative mg/dL   Spec Grav, UA >=0.102 (A) 1.010 - 1.025   Blood, UA trace-intact (A) negative   pH, UA 5.5 5.0 - 8.0   Protein Ur, POC negative negative mg/dL   Urobilinogen, UA 0.2 0.2 or 1.0 E.U./dL   Nitrite, UA Negative Negative   Leukocytes, UA Negative Negative   Labs Reviewed  CBC - Abnormal; Notable for the following components:      Result Value   RBC 5.92 (*)    All other components within normal limits  POCT URINALYSIS DIP (MANUAL ENTRY) - Abnormal; Notable for the following components:   Spec Grav, UA >=1.030 (*)    Blood, UA trace-intact (*)    All other components within normal limits  BASIC METABOLIC PANEL    Imaging: No results found.   No Known Allergies                                             Past Medical History:  Diagnosis Date   Allergy    Anxiety    GERD (gastroesophageal reflux disease)    Vertigo     Social History   Socioeconomic History   Marital status: Media planner    Spouse name: Not on file   Number of children: Not on file   Years of education: Not on file   Highest education level: Not on file  Occupational History   Not on file  Tobacco Use   Smoking status: Never   Smokeless tobacco: Never  Vaping Use   Vaping status: Never Used  Substance and Sexual Activity   Alcohol use: Yes    Alcohol/week: 1.0 - 2.0 standard drink of alcohol    Types: 1 - 2 Standard drinks or equivalent per week   Drug use: No   Sexual activity: Yes  Other Topics Concern   Not on file  Social History Narrative  Not on file   Social Determinants of Health   Financial Resource Strain: Not on file  Food Insecurity: Not on file  Transportation Needs: Not  on file  Physical Activity: Not on file  Stress: Not on file  Social Connections: Not on file  Intimate Partner Violence: Not on file    Family History  Problem Relation Age of Onset   Thyroid disease Mother    Hyperlipidemia Mother    Stroke Father    Stroke Maternal Grandmother      Mardella Layman, MD 09/19/22 1041

## 2023-03-25 DIAGNOSIS — K219 Gastro-esophageal reflux disease without esophagitis: Secondary | ICD-10-CM | POA: Diagnosis not present

## 2023-03-25 DIAGNOSIS — R3912 Poor urinary stream: Secondary | ICD-10-CM | POA: Diagnosis not present

## 2023-03-25 DIAGNOSIS — R1030 Lower abdominal pain, unspecified: Secondary | ICD-10-CM | POA: Diagnosis not present

## 2023-04-10 DIAGNOSIS — R1030 Lower abdominal pain, unspecified: Secondary | ICD-10-CM | POA: Diagnosis not present

## 2023-04-10 DIAGNOSIS — K219 Gastro-esophageal reflux disease without esophagitis: Secondary | ICD-10-CM | POA: Diagnosis not present

## 2023-04-10 DIAGNOSIS — R1310 Dysphagia, unspecified: Secondary | ICD-10-CM | POA: Diagnosis not present

## 2023-04-14 DIAGNOSIS — K259 Gastric ulcer, unspecified as acute or chronic, without hemorrhage or perforation: Secondary | ICD-10-CM | POA: Diagnosis not present

## 2023-04-14 DIAGNOSIS — K2950 Unspecified chronic gastritis without bleeding: Secondary | ICD-10-CM | POA: Diagnosis not present

## 2023-04-14 DIAGNOSIS — K219 Gastro-esophageal reflux disease without esophagitis: Secondary | ICD-10-CM | POA: Diagnosis not present

## 2023-04-14 DIAGNOSIS — R1310 Dysphagia, unspecified: Secondary | ICD-10-CM | POA: Diagnosis not present

## 2023-05-12 DIAGNOSIS — K219 Gastro-esophageal reflux disease without esophagitis: Secondary | ICD-10-CM | POA: Diagnosis not present

## 2023-05-12 DIAGNOSIS — R140 Abdominal distension (gaseous): Secondary | ICD-10-CM | POA: Diagnosis not present

## 2023-06-03 ENCOUNTER — Encounter (HOSPITAL_COMMUNITY): Payer: Self-pay

## 2023-06-03 ENCOUNTER — Emergency Department (HOSPITAL_COMMUNITY)
Admission: EM | Admit: 2023-06-03 | Discharge: 2023-06-04 | Disposition: A | Discharge status: Home / Self Care | Attending: Emergency Medicine | Admitting: Emergency Medicine

## 2023-06-03 ENCOUNTER — Other Ambulatory Visit: Payer: Self-pay

## 2023-06-03 ENCOUNTER — Emergency Department (HOSPITAL_COMMUNITY)

## 2023-06-03 DIAGNOSIS — R55 Syncope and collapse: Secondary | ICD-10-CM

## 2023-06-03 DIAGNOSIS — R079 Chest pain, unspecified: Secondary | ICD-10-CM | POA: Diagnosis not present

## 2023-06-03 DIAGNOSIS — R0602 Shortness of breath: Secondary | ICD-10-CM | POA: Diagnosis not present

## 2023-06-03 DIAGNOSIS — R202 Paresthesia of skin: Secondary | ICD-10-CM | POA: Insufficient documentation

## 2023-06-03 DIAGNOSIS — R42 Dizziness and giddiness: Secondary | ICD-10-CM | POA: Diagnosis not present

## 2023-06-03 LAB — CBC
HCT: 54.7 % — ABNORMAL HIGH (ref 39.0–52.0)
Hemoglobin: 18.1 g/dL — ABNORMAL HIGH (ref 13.0–17.0)
MCH: 27.1 pg (ref 26.0–34.0)
MCHC: 33.1 g/dL (ref 30.0–36.0)
MCV: 82 fL (ref 80.0–100.0)
Platelets: 211 10*3/uL (ref 150–400)
RBC: 6.67 MIL/uL — ABNORMAL HIGH (ref 4.22–5.81)
RDW: 13.2 % (ref 11.5–15.5)
WBC: 5.3 10*3/uL (ref 4.0–10.5)
nRBC: 0 % (ref 0.0–0.2)

## 2023-06-03 LAB — TROPONIN I (HIGH SENSITIVITY): Troponin I (High Sensitivity): 5 ng/L (ref <18)

## 2023-06-03 LAB — BASIC METABOLIC PANEL
Anion gap: 13 (ref 5–15)
BUN: 9 mg/dL (ref 6–20)
CO2: 18 mmol/L — ABNORMAL LOW (ref 22–32)
Calcium: 9.9 mg/dL (ref 8.9–10.3)
Chloride: 105 mmol/L (ref 98–111)
Creatinine, Ser: 0.94 mg/dL (ref 0.61–1.24)
GFR, Estimated: >60 mL/min (ref >60)
Glucose, Bld: 96 mg/dL (ref 70–99)
Potassium: 3.9 mmol/L (ref 3.5–5.1)
Sodium: 136 mmol/L (ref 135–145)

## 2023-06-03 NOTE — ED Triage Notes (Signed)
 Pt was at home shaving and started getting a tingling sensation in his chest that also migrated to his arms, more on the right than the left as well as some tingling in his legs. He has no apperance of facial droop but does seem like he is really having to serach for words to describ what he is feeling. He does express some chest pain as well. He mentions his heart rate did shoot up to 126 at home when this was going on.

## 2023-06-04 LAB — TROPONIN I (HIGH SENSITIVITY): Troponin I (High Sensitivity): 8 ng/L (ref <18)

## 2023-06-04 MED ORDER — SODIUM CHLORIDE 0.9 % IV BOLUS
500.0000 mL | Freq: Once | INTRAVENOUS | Status: AC
  Administered 2023-06-04: 500 mL via INTRAVENOUS

## 2023-06-04 NOTE — ED Provider Notes (Signed)
 [place] EMERGENCY DEPARTMENT AT [facility] Provider Note   CSN: [id number] Arrival date & time: 06/03/23  2214     History  Chief Complaint  Patient presents with   Shortness of Breath    Jeremy [name] is a 43 y.o. male with a history of GERD presents the ED today after presyncopal event.  Patient reports that he was shaving his head in the bathroom when he started to feel a tingling sensation all over his body with lightheadedness, chest pain, and shortness of breath all at the same time and he felt like he was going to pass out.  He did not lose consciousness.  Patient's watch told him his heart rate was elevated during this instance as well.  Denies any cardiac history.  He reports feeling the tingling sensation throughout his body before, but he said that this was more intense.  He has also had some chest discomfort in the past, that made him feel like he was get a pass out, which he related to gas.  Symptoms had resolved by the time of evaluation.  No additional complaints or concerns at this time.    Home Medications Prior to Admission medications   Medication Sig Start Date End Date Taking? Authorizing Provider  ibuprofen (ADVIL) 600 MG tablet Take 1 tablet (600 mg total) by mouth 3 (three) times daily. 08/06/22   Rising, Lurena Joiner, PA-C      Allergies    Patient has no known allergies.    Review of Systems   Review of Systems  Neurological:  Positive for light-headedness.  All other systems reviewed and are negative.   Physical Exam Updated Vital Signs BP 125/85   Pulse 69   Temp 98.2 F (36.8 C) (Oral)   Resp (!) 23   SpO2 99%  Physical Exam Vitals and nursing note reviewed.  Constitutional:      General: He is not in acute distress.    Appearance: Normal appearance.  HENT:     Head: Normocephalic and atraumatic.     Mouth/Throat:     Mouth: Mucous membranes are moist.  Eyes:     Conjunctiva/sclera: Conjunctivae normal.     Pupils: Pupils are  equal, round, and reactive to light.  Cardiovascular:     Rate and Rhythm: Normal rate and regular rhythm.     Pulses: Normal pulses.     Heart sounds: Normal heart sounds.  Pulmonary:     Effort: Pulmonary effort is normal.     Breath sounds: Normal breath sounds.  Abdominal:     Palpations: Abdomen is soft.     Tenderness: There is no abdominal tenderness.  Musculoskeletal:        General: Normal range of motion.     Cervical back: Normal range of motion.     Right lower leg: No edema.     Left lower leg: No edema.  Skin:    General: Skin is warm and dry.     Findings: No rash.  Neurological:     General: No focal deficit present.     Mental Status: He is alert.     Cranial Nerves: No cranial nerve deficit.     Sensory: No sensory deficit.     Motor: No weakness.     Comments: Sensation and strength of upper and lower extremities intact bilaterally.  Cranial nerves V and VII intact as well.  Psychiatric:        Mood and Affect: Mood normal.  Behavior: Behavior normal.   Orthostatic Vital Signs Orthostatic Lying BP- Lying: 127/89 Pulse- Lying: 72 Orthostatic Sitting BP- Sitting: 128/95 Abnormal  Pulse- Sitting: 68 Orthostatic Standing at 0 minutes BP- Standing at 0 minutes: 143/100 Abnormal  Pulse- Standing at 0 minutes: 81 ED Results / Procedures / Treatments   Labs (all labs ordered are listed, but only abnormal results are displayed) Labs Reviewed  BASIC METABOLIC PANEL - Abnormal; Notable for the following components:      Result Value   CO2 18 (*)    All other components within normal limits  CBC - Abnormal; Notable for the following components:   RBC 6.67 (*)    Hemoglobin 18.1 (*)    HCT 54.7 (*)    All other components within normal limits  TROPONIN I (HIGH SENSITIVITY)  TROPONIN I (HIGH SENSITIVITY)    EKG EKG Interpretation Date/Time:  Tuesday June 03 2023 22:19:42 EDT Ventricular Rate:  100 PR Interval:  160 QRS Duration:  86 QT  Interval:  330 QTC Calculation: 425 R Axis:   67  Text Interpretation: Normal sinus rhythm Normal ECG When compared with ECG of 13-Jan-2022 14:08, HEART RATE has increased Confirmed by Dione Booze ([id number]) on 06/03/2023 11:10:09 PM  Radiology DG Chest 2 View Result Date: 06/03/2023 CLINICAL DATA:  Chest pain, shortness of breath, lightheadedness. EXAM: CHEST - 2 VIEW COMPARISON:  01/13/2022 FINDINGS: Normal heart size and pulmonary vascularity. No focal airspace disease or consolidation in the lungs. No blunting of costophrenic angles. No pneumothorax. Mediastinal contours appear intact. Mild degenerative changes in the spine. IMPRESSION: No active cardiopulmonary disease. Electronically Signed   By: Burman Nieves M.D.   On: 06/03/2023 22:33    Procedures Procedures: not indicated.   Medications Ordered in ED Medications  sodium chloride 0.9 % bolus 500 mL (500 mLs Intravenous New Bag/Given 06/04/23 0126)    ED Course/ Medical Decision Making/ A&P                                 Medical Decision Making Amount and/or Complexity of Data Reviewed Labs: ordered. Radiology: ordered.   This patient presents to the ED for concern of presyncope, this involves an extensive number of treatment options, and is a complaint that carries with it a high risk of complications and morbidity.   Differential diagnosis includes: ACS, cardiac arrhythmia, pulmonary embolism, orthostatic hypotension, dehydration, anemia, vasovagal syncope, hypoglycemia, etc. Low suspicion for stroke - no transient or persistent weakness, numbness, confusion, slurred speech, facial droop Low suspicion for pulmonary embolism - PERC negative   Comorbidities  See HPI above   Additional History  Additional history obtained from prior records   Cardiac Monitoring / EKG  The patient was maintained on a cardiac monitor.  I personally viewed and interpreted the cardiac monitored which showed: Normal sinus rhythm  with a heart rate of 100 bpm.   Lab Tests  I ordered and personally interpreted labs.  The pertinent results include:   Initial troponin of 5, delta troponin of 8 BMP and CBC are reassuring - no acute electrolyte derangement, AKI, infection, or anemia   Imaging Studies  I ordered imaging studies including chest x-ray I independently visualized and interpreted imaging which showed:  No acute cardiopulmonary disease I agree with the radiologist interpretation   Problem List / ED Course / Critical Interventions / Medication Management  Patient reports he was shaving earlier tonight when he felt lightheaded,  chest pain, short of breath, and tingling throughout his body.  His watch indicated that he was tachycardic at this time.  Denies losing consciousness or history of syncope in the past.  Has had similar episodes of tingling throughout his body in the past as well as chest tightness which he has associated with gas.  No known cardiac history. Symptoms have improved by the time of evaluation. No focal neurologic deficits.  Low suspicion for CVA or TIA. Negative orthostatic vital signs. I ordered medications including: Fluids for lightheadedness  Reevaluation of the patient after these medicines showed that the patient improved. I have reviewed the patients home medicines and have made adjustments as needed   Social Determinants of Health  Access to healthcare   Test / Admission - Considered  Discussed findings with patient.  All questions were answered. He is hemodynamically stable and safe at home.  Ambulatory referral for cardiology sent. Return precautions given.       Final Clinical Impression(s) / ED Diagnoses Final diagnoses:  Pre-syncope    Rx / DC Orders ED Discharge Orders          Ordered    Ambulatory referral to Cardiology       Comments: If you have not heard from the Cardiology office within the next 72 hours please call [phone].   06/04/23  0313              Maxwell Marion, PA-C 06/04/23 0320    Dione Booze, MD 06/04/23 0981    Dione Booze, MD 06/04/23 [phone]

## 2023-06-04 NOTE — Discharge Instructions (Addendum)
 As discussed, your labs and imaging are reassuring.  There are no acute findings as the cause of feeling lightheaded like you get a pass out today.  Since your heart rate was elevated when this occurred, I have sent a referral to cardiology.  They will give you a call in the next couple days to schedule an appointment.  If you do not hear from them by the end of the week, you can give the office a call.  Their information will be on the discharge paperwork.  Otherwise, follow-up with your primary care doctor in the next 5 to 7 days for reevaluation.  Get help right away if: You pass out or faint. You have any of these symptoms: Fast or uneven heartbeats (palpitations). Pain in your chest, belly, or back. Shortness of breath. You have a seizure. You have a very bad headache. You are confused. You have trouble seeing. You are very weak. You have trouble walking. You are bleeding from your mouth or butt. You have black or tarry poop (stool).

## 2023-06-08 ENCOUNTER — Emergency Department (HOSPITAL_COMMUNITY)

## 2023-06-08 ENCOUNTER — Emergency Department (HOSPITAL_COMMUNITY): Admission: EM | Admit: 2023-06-08 | Discharge: 2023-06-08 | Disposition: A | Discharge status: Home / Self Care

## 2023-06-08 ENCOUNTER — Other Ambulatory Visit: Payer: Self-pay

## 2023-06-08 ENCOUNTER — Encounter (HOSPITAL_COMMUNITY): Payer: Self-pay

## 2023-06-08 DIAGNOSIS — R0789 Other chest pain: Secondary | ICD-10-CM | POA: Diagnosis not present

## 2023-06-08 DIAGNOSIS — R519 Headache, unspecified: Secondary | ICD-10-CM | POA: Insufficient documentation

## 2023-06-08 DIAGNOSIS — R079 Chest pain, unspecified: Secondary | ICD-10-CM | POA: Diagnosis not present

## 2023-06-08 DIAGNOSIS — R202 Paresthesia of skin: Secondary | ICD-10-CM | POA: Diagnosis not present

## 2023-06-08 DIAGNOSIS — R55 Syncope and collapse: Secondary | ICD-10-CM | POA: Diagnosis not present

## 2023-06-08 DIAGNOSIS — R29818 Other symptoms and signs involving the nervous system: Secondary | ICD-10-CM | POA: Diagnosis not present

## 2023-06-08 LAB — URINALYSIS, ROUTINE W REFLEX MICROSCOPIC
Bilirubin Urine: NEGATIVE
Glucose, UA: NEGATIVE mg/dL
Hgb urine dipstick: NEGATIVE
Ketones, ur: NEGATIVE mg/dL
Leukocytes,Ua: NEGATIVE
Nitrite: NEGATIVE
Protein, ur: NEGATIVE mg/dL
Specific Gravity, Urine: 1.002 — ABNORMAL LOW (ref 1.005–1.030)
pH: 7 (ref 5.0–8.0)

## 2023-06-08 LAB — TROPONIN I (HIGH SENSITIVITY)
Troponin I (High Sensitivity): 5 ng/L (ref <18)
Troponin I (High Sensitivity): 5 ng/L (ref <18)

## 2023-06-08 LAB — RAPID URINE DRUG SCREEN, HOSP PERFORMED
Amphetamines: NOT DETECTED
Barbiturates: NOT DETECTED
Benzodiazepines: NOT DETECTED
Cocaine: NOT DETECTED
Opiates: NOT DETECTED
Tetrahydrocannabinol: NOT DETECTED

## 2023-06-08 LAB — BASIC METABOLIC PANEL WITH GFR
Anion gap: 13 (ref 5–15)
BUN: 9 mg/dL (ref 6–20)
CO2: 20 mmol/L — ABNORMAL LOW (ref 22–32)
Calcium: 9.8 mg/dL (ref 8.9–10.3)
Chloride: 103 mmol/L (ref 98–111)
Creatinine, Ser: 1.04 mg/dL (ref 0.61–1.24)
GFR, Estimated: >60 mL/min (ref >60)
Glucose, Bld: 103 mg/dL — ABNORMAL HIGH (ref 70–99)
Potassium: 4.5 mmol/L (ref 3.5–5.1)
Sodium: 136 mmol/L (ref 135–145)

## 2023-06-08 LAB — ETHANOL: Alcohol, Ethyl (B): <10 mg/dL (ref <10)

## 2023-06-08 LAB — CBC
HCT: 52 % (ref 39.0–52.0)
Hemoglobin: 17.9 g/dL — ABNORMAL HIGH (ref 13.0–17.0)
MCH: 26.7 pg (ref 26.0–34.0)
MCHC: 34.4 g/dL (ref 30.0–36.0)
MCV: 77.6 fL — ABNORMAL LOW (ref 80.0–100.0)
Platelets: 200 10*3/uL (ref 150–400)
RBC: 6.7 MIL/uL — ABNORMAL HIGH (ref 4.22–5.81)
RDW: 13 % (ref 11.5–15.5)
WBC: 3.8 10*3/uL — ABNORMAL LOW (ref 4.0–10.5)
nRBC: 0 % (ref 0.0–0.2)

## 2023-06-08 LAB — PROTIME-INR
INR: 1 (ref 0.8–1.2)
Prothrombin Time: 13.1 s (ref 11.4–15.2)

## 2023-06-08 LAB — APTT: aPTT: 26 s (ref 24–36)

## 2023-06-08 MED ORDER — PROCHLORPERAZINE EDISYLATE 10 MG/2ML IJ SOLN
10.0000 mg | Freq: Once | INTRAMUSCULAR | Status: AC
  Administered 2023-06-08: 10 mg via INTRAVENOUS
  Filled 2023-06-08: qty 2

## 2023-06-08 MED ORDER — ACETAMINOPHEN 500 MG PO TABS
1000.0000 mg | ORAL_TABLET | Freq: Once | ORAL | Status: AC
  Administered 2023-06-08: 1000 mg via ORAL
  Filled 2023-06-08: qty 2

## 2023-06-08 MED ORDER — KETOROLAC TROMETHAMINE 15 MG/ML IJ SOLN
15.0000 mg | Freq: Once | INTRAMUSCULAR | Status: AC
  Administered 2023-06-08: 15 mg via INTRAVENOUS
  Filled 2023-06-08: qty 1

## 2023-06-08 NOTE — ED Notes (Signed)
 Pt remains off unit @ MRI

## 2023-06-08 NOTE — ED Triage Notes (Signed)
 Pt c/o left sided chest heaviness, left head pressure and left arm tingling started at 1130 today. Pt LKW 1129 today. Pt having SOB.

## 2023-06-08 NOTE — ED Provider Notes (Signed)
 Received patient in turnover from Dr. Maple Hudson.  Please see their note for further details of Hx, PE.  Briefly patient is a 43 y.o. male with a Chest Pain and headache .  Patient awaiting MRI brain to assess for stroke if negative likely home.  MRI of the brain is negative.  Patient feeling much better on repeat assessment.  Feeling his head is heavy but denies any other neurologic symptoms he had earlier.  Given neurology outpatient follow-up.  11 follow-up with his PCP as well.      Melene Plan, DO 06/08/23 1932

## 2023-06-08 NOTE — ED Provider Notes (Signed)
 [name] EM[facility] DE[facility] AT [facility] Provider Note   CSN: [id number] Arrival date & time: 06/08/23  1307     History  Chief Complaint  Patient presents with   Chest Pain   [name]    Jeremy [name] [name] is a 43 y.o. male.  This is a 43 year old male presenting emergency department with [name], Jeremy [name] subjective paresthesias and near syncope.  See ED course for HPI   Chest Pain      Home Medications Prior to Admission medications   Medication Sig Start Date End Date Taking? Authorizing Provider  ibuprofen (ADVIL) 600 MG tablet Take 1 tablet (600 mg total) by mouth 3 (three) times daily. 08/06/22   Rising, Lurena Joiner, PA-C      Allergies    Patient has no known allergies.    Review of Systems   Review of Systems  Cardiovascular:  Positive for chest pain.    Physical Exam Updated Vital Signs BP 132/82   Pulse 80   Temp 98 F (36.7 C)   Resp (!) 30   Ht 5\' 11"  (1.803 m)   Wt 96.2 kg   SpO2 100%   BMI 29.58 kg/m  Physical Exam Vitals and nursing note reviewed.  Constitutional:      General: He is not in acute distress.    Appearance: He is not toxic-appearing.  HENT:     Head: Normocephalic.  Cardiovascular:     Rate and Rhythm: Normal rate and regular rhythm.     Pulses:          Radial pulses are 2+ on the right side and 2+ on the left side.       Dorsalis pedis pulses are 2+ on the right side and 2+ on the left side.     Heart sounds: Normal heart sounds.  Pulmonary:     Breath sounds: Normal breath sounds.  Abdominal:     Palpations: Abdomen is soft.     Tenderness: There is no abdominal tenderness.  Musculoskeletal:     Right lower leg: No edema.     Left lower leg: No edema.  Neurological:     General: No focal deficit present.     Mental Status: He is alert and oriented to person, place, and time.     Cranial Nerves: No cranial nerve deficit.     Sensory: No sensory deficit.     Motor: No weakness.     Coordination:  Coordination normal.  Psychiatric:        Behavior: Behavior normal.     ED Results / Procedures / Treatments   Labs (all labs ordered are listed, but only abnormal results are displayed) Labs Reviewed  BASIC METABOLIC PANEL WITH GFR - Abnormal; Notable for the following components:      Result Value   CO2 20 (*)    Glucose, Bld 103 (*)    All other components within normal limits  CBC - Abnormal; Notable for the following components:   WBC 3.8 (*)    RBC 6.70 (*)    Hemoglobin 17.9 (*)    MCV 77.6 (*)    All other components within normal limits  URINALYSIS, ROUTINE W REFLEX MICROSCOPIC - Abnormal; Notable for the following components:   Color, Urine COLORLESS (*)    Specific Gravity, Urine 1.002 (*)    All other components within normal limits  PROTIME-INR  APTT  ETHANOL  RAPID URINE DRUG SCREEN, HOSP PERFORMED  TROPONIN I (HIGH SENSITIVITY)  TROPONIN I (  HIGH SENSITIVITY)    EKG EKG Interpretation Date/Time:  Sunday June 08 2023 13:13:58 EDT Ventricular Rate:  98 PR Interval:  146 QRS Duration:  86 QT Interval:  340 QTC Calculation: 434 R Axis:   61  Text Interpretation: Normal sinus rhythm Nonspecific T wave abnormality Abnormal ECG When compared with ECG of 03-Jun-2023 22:19, PREVIOUS ECG IS PRESENT Confirmed by Estanislado Pandy [phone]) on 06/08/2023 3:30:00 PM  Radiology CT Head Wo Contrast Result Date: 06/08/2023 CLINICAL DATA:  [name], increasing frequency or severity EXAM: CT HEAD WITHOUT CONTRAST TECHNIQUE: Contiguous axial images were obtained from the base of the skull through the vertex without intravenous contrast. RADIATION DOSE REDUCTION: This exam was performed according to the departmental dose-optimization program which includes automated exposure control, adjustment of the mA and/or kV according to patient size and/or use of iterative reconstruction technique. COMPARISON:  None Available. FINDINGS: Brain: No acute infarct or hemorrhage. Lateral  ventricles and midline structures are unremarkable. No acute extra-axial fluid collections. No mass effect. Vascular: No hyperdense vessel or unexpected calcification. Skull: Normal. Negative for fracture or focal lesion. Sinuses/Orbits: No acute finding. Other: None. IMPRESSION: 1. No acute intracranial process. Electronically Signed   By: Sharlet Salina M.D.   On: 06/08/2023 15:37   DG Chest 2 View Result Date: 06/08/2023 CLINICAL DATA:  cp EXAM: CHEST - 2 VIEW COMPARISON:  June 03, 2023 FINDINGS: The cardiomediastinal silhouette is unchanged and normal in contour. No pleural effusion. No pneumothorax. No acute pleuroparenchymal abnormality. Visualized abdomen is unremarkable. Mild multilevel degenerative changes of the thoracic spine. IMPRESSION: No acute cardiopulmonary abnormality. Electronically Signed   By: Meda Klinefelter M.D.   On: 06/08/2023 14:05    Procedures Procedures    Medications Ordered in ED Medications  prochlorperazine (COMPAZINE) injection 10 mg (has no administration in time range)  ketorolac (TORADOL) 15 MG/ML injection 15 mg (has no administration in time range)  acetaminophen (TYLENOL) tablet 1,000 mg (1,000 mg Oral Given 06/08/23 1417)    ED Course/ Medical Decision Making/ A&P Clinical Course as of 06/08/23 1539  Sun Jun 08, 2023  1413 Awoke this morning at 1030 with feeling off balance.  Around 1130 noticed head pressure/[name] with Jeremy [name] facial and upper extremity tingly sensation.  Near syncopal type event that resolved.  Notes Jeremy [name] chest pressure as well.  Reports symptoms did improve, but are still present.  No vision changes, facial droop, aphasia, reports possible weakness earlier this morning, but improved.  States he just feels "off" the near syncope/chest pressure feels similar to prior ED visit he had a few days ago. [TY]  1534 CT Head Wo Contrast No obvious intracranial hemorrhage on my depend interpretation. [TY]    Clinical Course User  Index [TY] Coral Spikes, DO                                 Medical Decision Making Is a well-appearing 43 year old male with history of vertigo, anxiety and GERD presenting emergency department for [name], subjective paresthesia to left face and upper extremity.  Last known normal last night when he went to bed.  Not having facial droop, aphasia, dysarthria.  Reported some balance issues and possible weakness earlier, but have seemingly resolved.  He then complains of near syncopal type episode with persistent Jeremy [name] chest pressure.  No shortness of breath nausea vomiting.  Denies any fevers chills.  No sick contacts.  Lower suspicion for acute stroke, however  is having [name] and subjective paresthesias.  Will get CT head and MRI.  Outside the window for acute intervention.  EKG without ST segment changes to indicate ischemia and similar to prior.  Troponin negative.  History not consistent with ACS.  No significant metabolic derangements.  Normal kidney function.  Does have a leukopenia and polycythemia on his CBC.  Chest x-ray on my independent interpretation with no pneumothorax, widened mediastinum or obvious pneumonia.  Migraine cocktail ordered. MRI pending. Care signed out to afternoon team; dispo pending MRI and re-evaluation.   Amount and/or Complexity of Data Reviewed Independent Historian:     Details: Family notes normal yesterday External Data Reviewed:     Details: No prior head imaging.  Does not appear to have prior cardiac testing either. Labs: ordered. Radiology: ordered. Decision-making details documented in ED Course.  Risk OTC drugs. Prescription drug management. Decision regarding hospitalization.         Final Clinical Impression(s) / ED Diagnoses Final diagnoses:  None    Rx / DC Orders ED Discharge Orders     None         Coral Spikes, DO 06/08/23 1539

## 2023-06-08 NOTE — Discharge Instructions (Signed)
 Your test here look good.  I does not mean that there is nothing going on with you.  As we discussed please return for any worsening or persistent symptoms especially if you develop one-sided numbness or weakness or difficulty speech or swallowing.  I have placed an online referral for the neurology office to call you to try and set up an appointment.  Please also call your family doctor and to see them in the office for repeat evaluation.

## 2023-06-08 NOTE — ED Notes (Addendum)
 Pt unable to provide urine at this time

## 2023-06-08 NOTE — ED Notes (Signed)
 Pt off unit to MRI.

## 2023-06-16 ENCOUNTER — Ambulatory Visit: Admitting: Diagnostic Neuroimaging

## 2023-06-16 ENCOUNTER — Encounter: Payer: Self-pay | Admitting: Diagnostic Neuroimaging

## 2023-06-16 VITALS — BP 128/76 | HR 63 | Ht 70.0 in | Wt 212.0 lb

## 2023-06-16 DIAGNOSIS — D510 Vitamin B12 deficiency anemia due to intrinsic factor deficiency: Secondary | ICD-10-CM | POA: Diagnosis not present

## 2023-06-16 DIAGNOSIS — R202 Paresthesia of skin: Secondary | ICD-10-CM

## 2023-06-16 DIAGNOSIS — R000 Tachycardia, unspecified: Secondary | ICD-10-CM | POA: Diagnosis not present

## 2023-06-16 DIAGNOSIS — E0590 Thyrotoxicosis, unspecified without thyrotoxic crisis or storm: Secondary | ICD-10-CM

## 2023-06-16 DIAGNOSIS — R42 Dizziness and giddiness: Secondary | ICD-10-CM | POA: Diagnosis not present

## 2023-06-16 DIAGNOSIS — R200 Anesthesia of skin: Secondary | ICD-10-CM

## 2023-06-16 DIAGNOSIS — R799 Abnormal finding of blood chemistry, unspecified: Secondary | ICD-10-CM | POA: Diagnosis not present

## 2023-06-16 NOTE — Progress Notes (Signed)
 [facility]  PATIENT: Jeremy [name] DOB: January 08, 1981  REFERRING CLINICIAN: Melene Plan, DO HISTORY FROM: patient  REASON FOR VISIT: new consult   HISTORICAL  CHIEF COMPLAINT:  Chief Complaint  Patient presents with   New Patient (Initial Visit)    Pt in 6, here with mother Sunny Schlein, and wife Vache Pt is referred by ED for complicated migraine. Pt states pressure and tingling on left side of head that radiates down left side. Pt states he has been feeling lightheaded and feeling off balance on and off x 1.5 weeks.     HISTORY OF PRESENT ILLNESS:   43 year old male here for evaluation of abnormal sensations.  06/03/2023 patient was shaving his head, when all of a sudden he felt off balance, whole body tingling, lightheaded sensation.  He then felt pressure sensation and tingling of the left face, shoulder and arm.  Went to the hospital for evaluation.  No specific cause was found.  Patient return to the hospital on 06/08/2023 for similar symptoms.  MRI of the brain was obtained which showed no acute findings.  Patient denies any prior or current headache problems.  Has some minor tingling sensations that come and go.  Also has intermittent episodes where his heart rate seems to speed up.   REVIEW OF SYSTEMS: Full 14 system review of systems performed and negative with exception of: as per HPI.  ALLERGIES: No Known Allergies  HOME MEDICATIONS: Outpatient Medications Prior to Visit  Medication Sig Dispense Refill   ibuprofen (ADVIL) 600 MG tablet Take 1 tablet (600 mg total) by mouth 3 (three) times daily. (Patient not taking: Reported on 06/16/2023) 30 tablet 0   No facility-administered medications prior to visit.    PAST MEDICAL HISTORY: Past Medical History:  Diagnosis Date   Allergy    Anxiety    GERD (gastroesophageal reflux disease)    Vertigo     PAST SURGICAL HISTORY: Past Surgical History:  Procedure Laterality Date   EYE SURGERY      FRACTURE SURGERY     NECK SURGERY     TONSILLECTOMY      FAMILY HISTORY: Family History  Problem Relation Age of Onset   Thyroid disease Mother    Hyperlipidemia Mother    Stroke Father    Stroke Maternal Grandmother     SOCIAL HISTORY: Social History   Socioeconomic History   Marital status: Media planner    Spouse name: Not on file   Number of children: Not on file   Years of education: Not on file   Highest education level: Not on file  Occupational History   Not on file  Tobacco Use   Smoking status: Never   Smokeless tobacco: Never  Vaping Use   Vaping status: Never Used  Substance and Sexual Activity   Alcohol use: Yes    Alcohol/week: 1.0 - 2.0 standard drink of alcohol    Types: 1 - 2 Standard drinks or equivalent per week   Drug use: No   Sexual activity: Yes  Other Topics Concern   Not on file  Social History Narrative   Not on file   Social Drivers of Health   Financial Resource Strain: High Risk (03/25/2023)   Received from [facility]   Overall Financial Resource Strain (CARDIA)    Difficulty of Paying Living Expenses: Hard  Food Insecurity: Food Insecurity Present (03/25/2023)   Received from [facility]   Hunger Vital Sign    Worried About Running Out of Food  in the Last Year: Sometimes true    Ran Out of Food in the Last Year: Sometimes true  Transportation Needs: Unmet Transportation Needs (03/25/2023)   Received from [facility] - Transportation    Lack of Transportation (Medical): Yes    Lack of Transportation (Non-Medical): Yes  Physical Activity: Sufficiently Active (03/25/2023)   Received from [facility]   Exercise Vital Sign    Days of Exercise per Week: 3 days    Minutes of Exercise per Session: 60 min  Stress: Stress Concern Present (03/25/2023)   Received from [facility] - Occupational Stress Questionnaire    Feeling of Stress : Rather much  Social Connections:  Somewhat Isolated (03/25/2023)   Received from [facility]   Social Network    How would you rate your social network (family, work, friends)?: Restricted participation with some degree of social isolation  Intimate Partner Violence: Not At Risk (03/25/2023)   Received from [facility]   HITS    Over the last 12 months how often did your partner physically hurt you?: Never    Over the last 12 months how often did your partner insult you or talk down to you?: Never    Over the last 12 months how often did your partner threaten you with physical harm?: Never    Over the last 12 months how often did your partner scream or curse at you?: Never     PHYSICAL EXAM  GENERAL EXAM/CONSTITUTIONAL: Vitals:  Vitals:   06/16/23 1339  BP: 128/76  Pulse: 63  Weight: 212 lb (96.2 kg)  Height: 5\' 10"  (1.778 m)  Orthostatic VS for the past 24 hrs (Last 3 readings):  BP- Lying Pulse- Lying BP- Standing at 3 minutes Pulse- Standing at 3 minutes  06/16/23 1348 128/76 63 132/89 84    Body mass index is 30.42 kg/m. Wt Readings from Last 3 Encounters:  06/16/23 212 lb (96.2 kg)  06/08/23 212 lb 1.3 oz (96.2 kg)  01/13/22 212 lb (96.2 kg)   Patient is in no distress; well developed, nourished and groomed; neck is supple  CARDIOVASCULAR: Examination of carotid arteries is normal; no carotid bruits Regular rate and rhythm, no murmurs Examination of peripheral vascular system by observation and palpation is normal  EYES: Ophthalmoscopic exam of optic discs and posterior segments is normal; no papilledema or hemorrhages No results found.  MUSCULOSKELETAL: Gait, strength, tone, movements noted in Neurologic exam below  NEUROLOGIC: MENTAL STATUS:      No data to display         awake, alert, oriented to person, place and time recent and remote memory intact normal attention and concentration language fluent, comprehension intact, naming intact fund of knowledge appropriate  CRANIAL  NERVE:  2nd - no papilledema on fundoscopic exam 2nd, 3rd, 4th, 6th - pupils equal and reactive to light, visual fields full to confrontation, extraocular muscles intact, no nystagmus 5th - facial sensation symmetric 7th - facial strength symmetric 8th - hearing intact 9th - palate elevates symmetrically, uvula midline 11th - shoulder shrug symmetric 12th - tongue protrusion midline  MOTOR:  normal bulk and tone, full strength in the BUE, BLE  SENSORY:  normal and symmetric to light touch, temperature, vibration  COORDINATION:  finger-nose-finger, fine finger movements normal  REFLEXES:  deep tendon reflexes present and symmetric  GAIT/STATION:  narrow based gait     DIAGNOSTIC DATA (LABS, IMAGING, TESTING) - I reviewed patient  records, labs, notes, testing and imaging myself where available.  Lab Results  Component Value Date   WBC 3.8 (L) 06/08/2023   HGB 17.9 (H) 06/08/2023   HCT 52.0 06/08/2023   MCV 77.6 (L) 06/08/2023   PLT 200 06/08/2023      Component Value Date/Time   NA 136 06/08/2023 1338   K 4.5 06/08/2023 1338   CL 103 06/08/2023 1338   CO2 20 (L) 06/08/2023 1338   GLUCOSE 103 (H) 06/08/2023 1338   BUN 9 06/08/2023 1338   CREATININE 1.04 06/08/2023 1338   CALCIUM 9.8 06/08/2023 1338   PROT 6.8 08/12/2013 0939   ALBUMIN 4.3 08/12/2013 0939   AST 34 08/12/2013 0939   ALT 32 08/12/2013 0939   ALKPHOS 54 08/12/2013 0939   BILITOT 0.2 08/12/2013 0939   GFRNONAA >60 06/08/2023 1338   GFRAA >90 02/29/2012 1306   No results found for: "CHOL", "HDL", "LDLCALC", "LDLDIRECT", "TRIG", "CHOLHDL" No results found for: "HGBA1C" No results found for: "VITAMINB12" Lab Results  Component Value Date   TSH 0.60 08/12/2013    06/08/23 MRI brain [I reviewed images myself and agree with interpretation. -VRP] - normal   ASSESSMENT AND PLAN  43 y.o. year old male here with:  Dx:  1. Tachycardia   2. Lightheadedness   3. Numbness and tingling      PLAN:  PRESYNCOPE / TINGLING / TACHYCARDIA (few spells; lasting minutes to hours; neuro exam and MRI brain are normal) - check labs - check cardiac event monitor - follow up with cardiology and PCP  Orders Placed This Encounter  Procedures   TSH Rfx on Abnormal to Free T4   Vitamin B12   Hemoglobin A1c   Cardiac event monitor   Return for pending if symptoms worsen or fail to improve, pending test results.    Suanne Marker, MD 06/16/2023, 2:21 PM Certified in Neurology, Neurophysiology and Neuroimaging  [facility] [address], [address] [place], [place] [address] [phone]

## 2023-06-16 NOTE — Patient Instructions (Signed)
  PRESYNCOPE / TINGLING / TACHYCARDIA - check labs - check cardiac event monitor - follow up with cardiology

## 2023-06-17 LAB — VITAMIN B12: Vitamin B-12: 715 pg/mL (ref 232–1245)

## 2023-06-17 LAB — HEMOGLOBIN A1C
Est. average glucose Bld gHb Est-mCnc: 111 mg/dL
Hgb A1c MFr Bld: 5.5 % (ref 4.8–5.6)

## 2023-06-18 ENCOUNTER — Encounter: Payer: Self-pay | Admitting: Diagnostic Neuroimaging

## 2023-06-18 LAB — TSH RFX ON ABNORMAL TO FREE T4: TSH: <0.005 u[IU]/mL — ABNORMAL LOW (ref 0.450–4.500)

## 2023-06-18 LAB — T4F: T4,Free (Direct): 3.12 ng/dL — ABNORMAL HIGH (ref 0.82–1.77)

## 2023-06-20 NOTE — Addendum Note (Signed)
 Addended by: Joycelyn Schmid R on: 06/20/2023 03:00 PM   Modules accepted: Orders

## 2023-06-23 ENCOUNTER — Telehealth: Payer: Self-pay | Admitting: Diagnostic Neuroimaging

## 2023-06-23 NOTE — Telephone Encounter (Signed)
 Referral endocrinology sent through [facility] to [facility]. Phone: [phone], Fax: [phone]

## 2023-07-04 DIAGNOSIS — E0590 Thyrotoxicosis, unspecified without thyrotoxic crisis or storm: Secondary | ICD-10-CM | POA: Diagnosis not present

## 2023-07-10 DIAGNOSIS — K219 Gastro-esophageal reflux disease without esophagitis: Secondary | ICD-10-CM | POA: Diagnosis not present

## 2023-07-10 DIAGNOSIS — R140 Abdominal distension (gaseous): Secondary | ICD-10-CM | POA: Diagnosis not present

## 2023-07-11 ENCOUNTER — Ambulatory Visit (INDEPENDENT_AMBULATORY_CARE_PROVIDER_SITE_OTHER): Admitting: Endocrinology

## 2023-07-11 ENCOUNTER — Encounter: Payer: Self-pay | Admitting: Endocrinology

## 2023-07-11 VITALS — BP 120/82 | HR 75 | Resp 20 | Ht 70.0 in | Wt 216.0 lb

## 2023-07-11 DIAGNOSIS — E0590 Thyrotoxicosis, unspecified without thyrotoxic crisis or storm: Secondary | ICD-10-CM

## 2023-07-11 DIAGNOSIS — E0500 Thyrotoxicosis with diffuse goiter without thyrotoxic crisis or storm: Secondary | ICD-10-CM

## 2023-07-11 NOTE — Progress Notes (Signed)
 Outpatient Endocrinology Note [place] Ugochukwu Chichester, MD   Patient's Name: Jeremy [name]    DOB: May 25, 1980    MRN: [id number]  REASON OF VISIT: New consult for hyperthyroidism  REFERRING PROVIDER: Omega Bible, MD  PCP: Calton Catholic, PA-C  HISTORY OF PRESENT ILLNESS:   Jeremy [name] is a 43 y.o. old male with past medical history as listed below is presented for new consult for hyperthyroidism.   Pertinent Thyroid History: Patient is referred to endocrinology for evaluation and management of newly diagnosed hyperthyroidism.  Patient has symptoms of off-balance, lightheadedness, starting in March 2025, had ER visits had MRI and CT scan without contrast with no acute findings, later evaluated by neurology and had thyroid function test on June 16, 2023 showed suppressed TSH with elevated free T4 3.12.  Patient has seen primary care provider on April 25 and had thyroid function test in outside facility [facility] reviewed lab results from care everywhere showed suppressed TSH with elevated free T4 of 2.27 and elevated free T3 of 9.6 consistent with hyperthyroidism.  Patient reports his symptoms are slowly improving.  Denies neck pain, cold symptoms or sore throat.  He has complaints of occasional palpitation, heat intolerance and increased sweating.  He has lost about 10 pounds of weight in last few months.  Denies change in bowel habit instead complains of constipation.  Heart rate today 75.  Family history of thyroid disorder Murrell Arrant' disease with mother.  No IV iodine contrast use recently.  No use of amiodarone. No sickness at the time of blood test.  No anterior neck compressive symptoms.  No redness or watering of the eyes.  Labs:   Latest Reference Range & Units 08/12/13 09:39 06/16/23 14:42  TSH 0.450 - 4.500 uIU/mL 0.60 <0.005 (L)  T4,Free (Direct) 0.82 - 1.77 ng/dL  6.29 (H)  (L): Data is abnormally low (H): Data is abnormally high   07/04/2023 07/04/2023 07/04/2023         T3, Free 9.6 High    -- --  TSH -- <0.005 Low    --  T4, Free (Direct) -- -- 2.27 High       REVIEW OF SYSTEMS:  As per history of present illness.   PAST MEDICAL HISTORY: Past Medical History:  Diagnosis Date   Allergy    Anxiety    GERD (gastroesophageal reflux disease)    Vertigo     PAST SURGICAL HISTORY: Past Surgical History:  Procedure Laterality Date   EYE SURGERY     FRACTURE SURGERY     NECK SURGERY     TONSILLECTOMY      ALLERGIES: No Known Allergies  FAMILY HISTORY:  Family History  Problem Relation Age of Onset   Thyroid disease Mother    Hyperlipidemia Mother    Stroke Father    Stroke Maternal Grandmother     SOCIAL HISTORY: Social History   Socioeconomic History   Marital status: Media planner    Spouse name: Not on file   Number of children: Not on file   Years of education: Not on file   Highest education level: Not on file  Occupational History   Not on file  Tobacco Use   Smoking status: Never   Smokeless tobacco: Never  Vaping Use   Vaping status: Never Used  Substance and Sexual Activity   Alcohol use: Yes    Alcohol/week: 1.0 - 2.0 standard drink of alcohol    Types: 1 - 2 Standard drinks or equivalent per week  Drug use: No   Sexual activity: Yes  Other Topics Concern   Not on file  Social History Narrative   Not on file   Social Drivers of Health   Financial Resource Strain: High Risk (03/25/2023)   Received from [facility]   Overall Financial Resource Strain (CARDIA)    Difficulty of Paying Living Expenses: Hard  Food Insecurity: Food Insecurity Present (03/25/2023)   Received from [facility]   Hunger Vital Sign    Worried About Running Out of Food in the Last Year: Sometimes true    Ran Out of Food in the Last Year: Sometimes true  Transportation Needs: Unmet Transportation Needs (03/25/2023)   Received from [facility] - Transportation    Lack of Transportation (Medical): Yes    Lack  of Transportation (Non-Medical): Yes  Physical Activity: Sufficiently Active (03/25/2023)   Received from [facility]   Exercise Vital Sign    Days of Exercise per Week: 3 days    Minutes of Exercise per Session: 60 min  Stress: Stress Concern Present (03/25/2023)   Received from [facility] - Occupational Stress Questionnaire    Feeling of Stress : Rather much  Social Connections: Somewhat Isolated (03/25/2023)   Received from [facility]   Social Network    How would you rate your social network (family, work, friends)?: Restricted participation with some degree of social isolation    MEDICATIONS:  Current Outpatient Medications  Medication Sig Dispense Refill   pantoprazole  (PROTONIX ) 40 MG tablet Take 40 mg by mouth daily.     ibuprofen  (ADVIL ) 600 MG tablet Take 1 tablet (600 mg total) by mouth 3 (three) times daily. (Patient not taking: Reported on 07/11/2023) 30 tablet 0   No current facility-administered medications for this visit.    PHYSICAL EXAM: Vitals:   07/11/23 1100  BP: 120/82  Pulse: 75  Resp: 20  SpO2: 96%  Weight: 216 lb (98 kg)  Height: 5\' 10"  (1.778 m)   Body mass index is 30.99 kg/m.  Wt Readings from Last 3 Encounters:  07/11/23 216 lb (98 kg)  06/16/23 212 lb (96.2 kg)  06/08/23 212 lb 1.3 oz (96.2 kg)    General: Well developed, well nourished male in no apparent distress.  HEENT: AT/[place], no external lesions. Hearing intact to the spoken word Eyes: EOMI. No stare, proptosis or lid lag. Conjunctiva clear and no icterus. No erythema or watering Neck: Trachea midline, neck supple with mild appreciable thyromegaly or no lymphadenopathy and no palpable thyroid nodules Lungs: Clear to auscultation, no wheeze. Respirations not labored Heart: S1S2, Regular in rate and rhythm. No loud murmurs Abdomen: Soft, non tender, non distended Neurologic: Alert, oriented, normal speech, deep tendon biceps reflexes  normal,  no gross focal neurological deficit Extremities: No pedal pitting edema, mild tremors of outstretched hands Skin: Warm, color good.  Psychiatric: Does not appear depressed or anxious  PERTINENT HISTORIC LABORATORY AND IMAGING STUDIES:  All pertinent laboratory results were reviewed. Please see HPI also for further details.   TSH  Date Value Ref Range Status  06/16/2023 <0.005 (L) 0.450 - 4.500 uIU/mL Final  08/12/2013 0.60 0.35 - 4.50 uIU/mL Final    Lab Results  Component Value Date   TSH <0.005 (L) 06/16/2023   TSH 0.60 08/12/2013    No results found for: "THYROTRECAB"  Lab Results  Component Value Date   TSH <0.005 (L) 06/16/2023   TSH 0.60 08/12/2013  No results found for: "TSI"   No components found for: "TRAB"    ASSESSMENT / PLAN  1. Hyperthyroidism    -Patient has hyperthyroidism laboratory results in July 04, 2023 at outside facility stained with hyperthyroidism with elevated free T4, free T3 and suppressed TSH, noted in HPI.  He has family history of Graves' disease in mother.  -Discussed various causes of hyperthyroidism including autoimmune hyperthyroidism/Graves' disease, hyperfunctioning thyroid nodule and thyroiditis.  Overall clinical picture seems to be likely Graves' disease.  However he did not have thyroid autoantibodies test in the past.   The three options of therapy for hyperthyroidism were discussed with the patient, including thionamide drug therapy, thyroidectomy, and radioactive iodine ablation. I reviewed the possible complications of rash, agranulocytosis, or liver dysfunction associated with anti-thyroid therapies. I reviewed the risks of hemorrhage, hypocalcemia, hoarseness and hypothyroidism after thyroidectomy. Regarding radioactive iodine ablation, I informed the patient that most patients treated with radioactive iodine can be cured of hyperthyroidism with a single dose, but to about 15-20% may require an additional dose. I  emphasized that most patients treated with radioactive iodine develop permanent hypothyroidism, requiring lifelong replacement with thyroid hormone. I discussed the rare occurrence of transient increase in thyroid hormone levels after radioactive iodine and associated with symptoms, possible worsening of Grave's eye disease, and the likely small but not insignificant risks associated with radiation exposure of this kind.    Blood test results today we will likely start him on antithyroid medication methimazole.  Plan: - Check TSI and TRAb thyroid autoantibodies for Graves' disease. -Consider imaging including ultrasound thyroid or radioactive iodine uptake and scan if needed. - Recent thyroid function test about a week ago, no lab for thyroid function test today. - Will likely start on methimazole after the test results from today.   Diagnoses and all orders for this visit:  Hyperthyroidism -     Thyroid stimulating immunoglobulin -     TRAb (TSH Receptor Binding Antibody)    DISPOSITION Follow up in clinic in 6-7 weeks suggested.  All questions answered and patient verbalized understanding of the plan.  [place] Isaah Furry, MD [facility] [address] [place], [address] [place], [place] [address] Phone # [phone]   At least part of this note was generated using voice recognition software. Inadvertent word errors may have occurred, which were not recognized during the proofreading process.

## 2023-07-11 NOTE — Patient Instructions (Signed)
Methimazole Tablets What is this medication? METHIMAZOLE (meth IM a zole) treats high thyroid levels (hyperthyroidism) in your body. It works by decreasing the amount of thyroid hormone your body makes. This medicine may be used for other purposes; ask your health care provider or pharmacist if you have questions. COMMON BRAND NAME(S): Northyx, Tapazole What should I tell my care team before I take this medication? They need to know if you have any of these conditions: Liver disease Low white blood cell levels An unusual or allergic reaction to methimazole, other medications, foods, dyes, or preservatives Pregnant or trying to get pregnant Breastfeeding How should I use this medication? Take this medication by mouth with a glass of water. Follow the directions on the prescription label. You can take this medication with or without food. However, you should always take it the same way to make sure the effects are the same. Take your doses at regular intervals. Do not take your medication more often than directed. Do not stop taking this medication except on the advice of your care team. Talk to your care team about the use of this medication in children. Special care may be needed. While this medication may be prescribed for children for selected conditions, precautions do apply. Overdosage: If you think you have taken too much of this medicine contact a poison control center or emergency room at once. NOTE: This medicine is only for you. Do not share this medicine with others. What if I miss a dose? If you miss a dose, take it as soon as you can. If it is almost time for your next dose, take only that dose. Do not take double or extra doses. What may interact with this medication? Aminophylline Certain medications for blood pressure, heart disease, or irregular heartbeat, such as metoprolol and propranolol Digoxin Theophylline Warfarin This list may not describe all possible interactions.  Give your health care provider a list of all the medicines, herbs, non-prescription drugs, or dietary supplements you use. Also tell them if you smoke, drink alcohol, or use illegal drugs. Some items may interact with your medicine. What should I watch for while using this medication? Visit your care team for regular checks on your progress. Tell your care team if your symptoms do not start to get better or if they get worse. It may be some time before you see the benefit from this medication. You may need blood work done while you are taking this medication. Biotin (vitamin B7) may interfere with your thyroid function test. Stop taking supplements that contain biotin 2 days before your blood work. This medication may increase your risk of getting an infection. Call your care team for advice if you get a fever, chills, sore throat, or other symptoms of a cold or flu. Do not treat yourself. Try to avoid being around people who are sick. Talk to your care team if you may be pregnant. Serious birth defects can occur if you take this medication during pregnancy. If you are going to need surgery or a procedure, tell your care team that you are taking this medication. What side effects may I notice from receiving this medication? Side effects that you should report to your care team as soon as possible: Allergic reactions--skin rash, itching, hives, swelling of the face, lips, tongue, or throat Infection--fever, chills, cough, or sore throat Liver injury--right upper belly pain, loss of appetite, nausea, light-colored stool, dark yellow or brown urine, yellowing skin or eyes, unusual weakness or fatigue Low  thyroid levels (hypothyroidism)--unusual weakness or fatigue, increased sensitivity to cold, constipation, hair loss, dry skin, weight gain, feelings of depression Unusual weakness or fatigue, fever, headache, skin rash, muscle or joint pain, loss of appetite, pain, tingling, or numbness in the hands or  feet Side effects that usually do not require medical attention (report to your care team if they continue or are bothersome): Change in taste Dizziness Hair loss Headache Nausea Upset stomach This list may not describe all possible side effects. Call your doctor for medical advice about side effects. You may report side effects to FDA at 1-800-FDA-1088. Where should I keep my medication? Keep out of the reach of children. Store at room temperature between 20 and 25 degrees C (68 and 77 degrees F). Keep tightly closed. Protect from light. Throw away any unused medication after the expiration date. NOTE: This sheet is a summary. It may not cover all possible information. If you have questions about this medicine, talk to your doctor, pharmacist, or health care provider.  2024 Elsevier/Gold Standard (2022-05-17 00:00:00)

## 2023-07-15 ENCOUNTER — Telehealth: Payer: Self-pay

## 2023-07-15 ENCOUNTER — Encounter: Payer: Self-pay | Admitting: Endocrinology

## 2023-07-15 LAB — THYROID STIMULATING IMMUNOGLOBULIN: TSI: 231 %{baseline} — ABNORMAL HIGH (ref <140)

## 2023-07-15 LAB — TRAB (TSH RECEPTOR BINDING ANTIBODY): TRAB: 8.34 IU/L — ABNORMAL HIGH (ref <=2.00)

## 2023-07-15 MED ORDER — METHIMAZOLE 5 MG PO TABS: 15.0000 mg | ORAL_TABLET | Freq: Every day | ORAL | 3 refills | Status: DC

## 2023-07-15 NOTE — Telephone Encounter (Signed)
-----   Message from [place] Thapa sent at 07/15/2023  4:20 PM EDT ----- Please notify patient of labs reviewed elevated thyroid autoantibodies TSI and TRAb consistent with Graves' disease causing hyperthyroidism.  I have sent prescription for methimazole 15 mg daily.  Placed order to have lab 2 to 3 days prior to follow-up visit with me in June.  Arrange for lab visit accordingly.

## 2023-07-15 NOTE — Telephone Encounter (Signed)
 Patient given results and medication changes as directed by MD. No further questions at this time. Patient transferred to front desk to place lab appointment.

## 2023-07-15 NOTE — Addendum Note (Signed)
 Addended by: Trentan Trippe, [place] on: 07/15/2023 04:19 PM   Modules accepted: Orders

## 2023-07-21 ENCOUNTER — Ambulatory Visit: Attending: Diagnostic Neuroimaging

## 2023-07-21 DIAGNOSIS — R42 Dizziness and giddiness: Secondary | ICD-10-CM

## 2023-07-21 DIAGNOSIS — R000 Tachycardia, unspecified: Secondary | ICD-10-CM

## 2023-07-22 ENCOUNTER — Ambulatory Visit: Payer: Self-pay | Admitting: Diagnostic Neuroimaging

## 2023-07-22 DIAGNOSIS — R42 Dizziness and giddiness: Secondary | ICD-10-CM

## 2023-07-22 DIAGNOSIS — R000 Tachycardia, unspecified: Secondary | ICD-10-CM | POA: Diagnosis not present

## 2023-07-25 ENCOUNTER — Telehealth: Payer: Self-pay

## 2023-07-25 NOTE — Telephone Encounter (Signed)
 Patient left VM stating her has been having a sharp pain under left breast after taking his methimazole  on 07/23/23 and again today.   RN called patient back to discuss his symptoms. Per patient it has only happened twice. Each time approximately 30 minutes after taking the medication, but states it only lasts a couple of seconds. No pain noted currently while speaking to patient. Patient does not have any other symptoms including tingling in arm,SOB, a crushing feeling etc. Patient stated when he defecated  earlier this morning after taking the medicine he felt sweaty and heart rate was at 97 (high normal), but went away after a few seconds like before. Patient is on Protonix  as well, however he stated it did not feel like indigestion/reflux.

## 2023-07-28 ENCOUNTER — Telehealth: Payer: Self-pay

## 2023-07-28 NOTE — Telephone Encounter (Signed)
 Patient called and asked if he was still having intermittent CP. Per patient his last experience was on Friday. Patient made aware that unlikely Methimazole  that is causing that symptom and patient should go to ER for further evaluation if it continues to happen per MD. No further questions or concerns at this time.

## 2023-07-28 NOTE — Telephone Encounter (Signed)
-----   Message from [place] Thapa sent at 07/28/2023  2:41 PM EDT ----- It is unlikely to be related to side effect of methimazole  to have chest pain.  Please call and check with the patient if he is still having on and off chest pain I would recommend to go to ER for further evaluation.  Thanks. ----- Message ----- From: Tara Fanti, RN Sent: 07/28/2023  11:41 AM EDT To: [place] Thapa, MD  Hey Dr. Aretha Kubas this patient called on Friday, I sent it to Dr. Melven Stable but I havent heard anything back yet so wanted to forward to you to advise thanks. The patient was not in active CP so I didn't call her nor did he have any heart related symptoms he was made aware all Mds were gone for the day, but I advised him if he should feel SOB,crushing feeling, tingling in left arm or CP that is not resolving he needed to go to ER while he is waiting on us  to call back. thanks

## 2023-08-19 ENCOUNTER — Other Ambulatory Visit

## 2023-08-19 DIAGNOSIS — E0590 Thyrotoxicosis, unspecified without thyrotoxic crisis or storm: Secondary | ICD-10-CM | POA: Diagnosis not present

## 2023-08-19 LAB — T3, FREE: T3, Free: 2.9 pg/mL (ref 2.3–4.2)

## 2023-08-19 LAB — T4, FREE: Free T4: 0.9 ng/dL (ref 0.8–1.8)

## 2023-08-20 ENCOUNTER — Ambulatory Visit: Payer: Self-pay | Admitting: Endocrinology

## 2023-08-26 ENCOUNTER — Encounter: Payer: Self-pay | Admitting: Endocrinology

## 2023-08-26 ENCOUNTER — Ambulatory Visit (INDEPENDENT_AMBULATORY_CARE_PROVIDER_SITE_OTHER): Admitting: Endocrinology

## 2023-08-26 VITALS — BP 114/80 | HR 63 | Resp 20 | Ht 70.0 in | Wt 221.2 lb

## 2023-08-26 DIAGNOSIS — E0500 Thyrotoxicosis with diffuse goiter without thyrotoxic crisis or storm: Secondary | ICD-10-CM | POA: Diagnosis not present

## 2023-08-26 MED ORDER — METHIMAZOLE 5 MG PO TABS: 10.0000 mg | ORAL_TABLET | Freq: Every day | ORAL | 3 refills | Status: DC

## 2023-08-26 NOTE — Progress Notes (Signed)
 Outpatient Endocrinology Note Jeremy Syann Cupples, MD   Patient's Name: Jeremy [name]    DOB: October 25, 1980    MRN: [id number]  REASON OF VISIT: Follow-up for hyperthyroidism Jeremy Arrant' disease  REFERRING PROVIDER: Omega Bible, MD  PCP: Calton Catholic, PA-C  HISTORY OF PRESENT ILLNESS:   Jeremy [name] is a 43 y.o. old male with past medical history as listed below is presented for follow-up for hyperthyroidism Jeremy Arrant' disease.   Pertinent Thyroid  History: Patient has hyperthyroidism due to Graves' disease, on methimazole .  Background: Patient was referred to endocrinology for further evaluation and management of newly diagnosed Graves' disease, initial consult was in May 2025. Patient had symptoms of off-balance, lightheadedness, starting in March 2025, had ER visits had MRI and CT scan without contrast with no acute findings, later evaluated by neurology and had thyroid  function test on June 16, 2023 showed suppressed TSH with elevated free T4 3.12.  Patient has seen primary care provider on April 25 and had thyroid  function test in outside facility [facility] reviewed lab results from care everywhere showed suppressed TSH with elevated free T4 of 2.27 and elevated free T3 of 9.6 consistent with hyperthyroidism.  Patient had symptoms of weight loss, palpitation, heat intolerance, increased sweating and hand tremor.  Family history of hyperthyroidism/Graves' disease in mother.  -In May 2025 : Had elevated TSI and TRAb consistent with Graves' disease causing hyperthyroidism.   Latest Reference Range & Units 07/11/23 11:47  TSI <140 % baseline 231 (H)  TRAB <=2.00 IU/L 8.34 (H)  (H): Data is abnormally high  -Patient was started on methimazole  15 mg daily in the beginning of May 2025.  Interval history: Patient was started on methimazole  15 mg daily from last visit.  Patient reports after 1 and 1/2  weeks of being on methimazole  he started to feel better resolution of palpitation,  heat intolerance and imbalance.  In the beginning he had transient itching of the skin and neck discomfort.  Denies palpitation or heat intolerance at this time.  No more redness or watering of the eyes.  No hand tremors.  No GI upset, no recent illness or fever.  He has been taking methimazole  15 mg daily in the morning.  Patient thyroid  function test normalized with normal free T4 and free T3 as follows.   Latest Reference Range & Units 08/19/23 11:01  Triiodothyronine,Free,Serum 2.3 - 4.2 pg/mL 2.9  T4,Free(Direct) 0.8 - 1.8 ng/dL 0.9    REVIEW OF SYSTEMS:  As per history of present illness.   PAST MEDICAL HISTORY: Past Medical History:  Diagnosis Date   Allergy    Anxiety    GERD (gastroesophageal reflux disease)    Vertigo     PAST SURGICAL HISTORY: Past Surgical History:  Procedure Laterality Date   EYE SURGERY     FRACTURE SURGERY     NECK SURGERY     TONSILLECTOMY      ALLERGIES: No Known Allergies  FAMILY HISTORY:  Family History  Problem Relation Age of Onset   Thyroid  disease Mother    Hyperlipidemia Mother    Stroke Father    Stroke Maternal Grandmother     SOCIAL HISTORY: Social History   Socioeconomic History   Marital status: Media planner    Spouse name: Not on file   Number of children: Not on file   Years of education: Not on file   Highest education level: Not on file  Occupational History   Not on file  Tobacco Use  Smoking status: Never   Smokeless tobacco: Never  Vaping Use   Vaping status: Never Used  Substance and Sexual Activity   Alcohol use: Yes    Alcohol/week: 1.0 - 2.0 standard drink of alcohol    Types: 1 - 2 Standard drinks or equivalent per week   Drug use: No   Sexual activity: Yes  Other Topics Concern   Not on file  Social History Narrative   Not on file   Social Drivers of Health   Financial Resource Strain: High Risk (03/25/2023)   Received from [facility]   Overall Financial Resource Strain (CARDIA)     Difficulty of Paying Living Expenses: Hard  Food Insecurity: Food Insecurity Present (03/25/2023)   Received from [facility]   Hunger Vital Sign    Within the past 12 months, you worried that your food would run out before you got the money to buy more.: Sometimes true    Within the past 12 months, the food you bought just didn't last and you didn't have money to get more.: Sometimes true  Transportation Needs: Unmet Transportation Needs (03/25/2023)   Received from [facility] - Transportation    Lack of Transportation (Medical): Yes    Lack of Transportation (Non-Medical): Yes  Physical Activity: Sufficiently Active (03/25/2023)   Received from [facility]   Exercise Vital Sign    On average, how many days per week do you engage in moderate to strenuous exercise (like a brisk walk)?: 3 days    On average, how many minutes do you engage in exercise at this level?: 60 min  Stress: Stress Concern Present (03/25/2023)   Received from [facility] - Occupational Stress Questionnaire    Feeling of Stress : Rather much  Social Connections: Somewhat Isolated (03/25/2023)   Received from [facility]   Social Network    How would you rate your social network (family, work, friends)?: Restricted participation with some degree of social isolation    MEDICATIONS:  Current Outpatient Medications  Medication Sig Dispense Refill   ibuprofen  (ADVIL ) 600 MG tablet Take 1 tablet (600 mg total) by mouth 3 (three) times daily. (Patient taking differently: Take 600 mg by mouth as needed.) 30 tablet 0   pantoprazole  (PROTONIX ) 40 MG tablet Take 40 mg by mouth daily.     methimazole  (TAPAZOLE ) 5 MG tablet Take 2 tablets (10 mg total) by mouth daily. 180 tablet 3   No current facility-administered medications for this visit.    PHYSICAL EXAM: Vitals:   08/26/23 1038  BP: 114/80  Pulse: 63  Resp: 20  SpO2: 98%  Weight: 221 lb 3.2 oz  (100.3 kg)  Height: 5' 10 (1.778 m)   Body mass index is 31.74 kg/m.  Wt Readings from Last 3 Encounters:  08/26/23 221 lb 3.2 oz (100.3 kg)  07/11/23 216 lb (98 kg)  06/16/23 212 lb (96.2 kg)    General: Well developed, well nourished male in no apparent distress.  HEENT: AT/[place], no external lesions. Hearing intact to the spoken word Eyes: EOMI. No stare, proptosis or lid lag. Conjunctiva clear and no icterus. No erythema or watering Neck: Trachea midline, neck supple with mild appreciable thyromegaly or no lymphadenopathy and no palpable thyroid  nodules Lungs: Clear to auscultation, no wheeze. Respirations not labored Heart: S1S2, Regular in rate and rhythm. No loud murmurs Abdomen: Soft, non tender, non distended Neurologic: Alert, oriented, normal speech, deep tendon biceps reflexes  normal,  no gross focal neurological deficit Extremities: No pedal pitting edema, mild tremors of outstretched hands Skin: Warm, color good.  Psychiatric: Does not appear depressed or anxious  PERTINENT HISTORIC LABORATORY AND IMAGING STUDIES:  All pertinent laboratory results were reviewed. Please see HPI also for further details.   TSH  Date Value Ref Range Status  06/16/2023 <0.005 (L) 0.450 - 4.500 uIU/mL Final  08/12/2013 0.60 0.35 - 4.50 uIU/mL Final    Lab Results  Component Value Date   FREET4 0.9 08/19/2023   T3FREE 2.9 08/19/2023   TSH <0.005 (L) 06/16/2023   TSH 0.60 08/12/2013    No results found for: THYROTRECAB  Lab Results  Component Value Date   TSH <0.005 (L) 06/16/2023   TSH 0.60 08/12/2013   FREET4 0.9 08/19/2023     Lab Results  Component Value Date   TSI 231 (H) 07/11/2023     No components found for: TRAB       07/04/2023 07/04/2023 07/04/2023        T3, Free 9.6 High    -- --  TSH -- <0.005 Low    --  T4, Free (Direct) -- -- 2.27 High        ASSESSMENT / PLAN  1. Graves disease     -Patient has hyperthyroidism secondary to Graves'  disease has positive TRAb and TSI.  He has been on methimazole  since May 2025, initial dose was 15 mg daily.  He is currently taking methimazole  50 mg daily.  He is clinically euthyroid today.  Recent thyroid  function test with normalized free T4 and free T3.   The three options of therapy for hyperthyroidism were discussed with the patient, including thionamide drug therapy, thyroidectomy, and radioactive iodine ablation. I reviewed the possible complications of rash, agranulocytosis, or liver dysfunction associated with anti-thyroid  therapies. I reviewed the risks of hemorrhage, hypocalcemia, hoarseness and hypothyroidism after thyroidectomy. Regarding radioactive iodine ablation, I informed the patient that most patients treated with radioactive iodine can be cured of hyperthyroidism with a single dose, but to about 15-20% may require an additional dose. I emphasized that most patients treated with radioactive iodine develop permanent hypothyroidism, requiring lifelong replacement with thyroid  hormone. I discussed the rare occurrence of transient increase in thyroid  hormone levels after radioactive iodine and associated with symptoms, possible worsening of Grave's eye disease, and the likely small but not insignificant risks associated with radiation exposure of this kind.    Will stay on antithyroid medication at this time.  Potential spontaneous resolution of Graves' disease can happen over time.  Plan: - Decrease methimazole  from 15 to 10 mg daily. - He needs close endocrinology follow-up. - Check thyroid  function test in 2 months prior to follow-up visit.   Diagnoses and all orders for this visit:  Graves disease -     T4, free -     T3, free -     TSH -     methimazole  (TAPAZOLE ) 5 MG tablet; Take 2 tablets (10 mg total) by mouth daily.    DISPOSITION Follow up in clinic in 2 months suggested.  All questions answered and patient verbalized understanding of the plan.  Jeremy Sarvesh Meddaugh,  MD [facility] Endocrinology [facility] [address] [place], [address] [place], [place] [address] Phone # [phone]   At least part of this note was generated using voice recognition software. Inadvertent word errors may have occurred, which were not recognized during the proofreading process.

## 2023-08-26 NOTE — Patient Instructions (Signed)
Decrease methimazole to 10 mg daily.

## 2023-09-02 ENCOUNTER — Encounter: Payer: Self-pay | Admitting: Cardiology

## 2023-09-02 ENCOUNTER — Ambulatory Visit: Attending: Cardiology | Admitting: Cardiology

## 2023-09-02 VITALS — BP 110/90 | HR 68 | Ht 70.0 in | Wt 223.4 lb

## 2023-09-02 DIAGNOSIS — R42 Dizziness and giddiness: Secondary | ICD-10-CM | POA: Diagnosis not present

## 2023-09-02 DIAGNOSIS — E0500 Thyrotoxicosis with diffuse goiter without thyrotoxic crisis or storm: Secondary | ICD-10-CM | POA: Diagnosis not present

## 2023-09-02 DIAGNOSIS — R55 Syncope and collapse: Secondary | ICD-10-CM | POA: Diagnosis not present

## 2023-09-02 DIAGNOSIS — R072 Precordial pain: Secondary | ICD-10-CM | POA: Insufficient documentation

## 2023-09-02 NOTE — Progress Notes (Addendum)
 Cardiology Office Note:  .   Date:  09/02/2023  ID:  Jeremy [name], DOB Sep 18, 1980, MRN [id number] PCP: Emilio Joesph VEAR DEVONNA  [facility] Providers Cardiologist:  Newman Lawrence, MD PCP: Emilio Joesph VEAR, PA-C  Chief Complaint  Patient presents with   Chest Pain     Jeremy [name] is a 43 y.o. male with Graves' disease, chest pain  Discussed the use of AI scribe software for clinical note transcription with the patient, who gave verbal consent to proceed.  History of Present Illness Jeremy [name] is a 43 year old male with hyperthyroidism who presents with lightheadedness and chest tightness.  In March, he experienced nearly fainting, feeling off balance, lightheadedness, and a rapid heartbeat, leading to an ER visit. Tingling sensations in the head, neck, and arms were also present.  He was diagnosed with hypothyroidism and Graves' disease, with a TSH <0.005.  After starting methimazole , symptoms significantly reduced, but he occasionally experiences transient tingling in the shoulder and tightness in the neck. He also has occasional tightness in the upper chest.  During walking, he sometimes feels tightness in the chest or pressure in the head, though these symptoms are inconsistent. He has not engaged in much physical activity aside from walking.  Family history includes heart complications in his mother related to medication use. He quit smoking several years ago and it was not a daily habit. He is not currently employed or active.      Vitals:   09/02/23 1351  BP: (!) 110/90  Pulse: 68  SpO2: 97%      Review of Systems  Cardiovascular:  Positive for chest pain. Negative for dyspnea on exertion, leg swelling, palpitations and syncope.        Studies Reviewed: SABRA        Labs 05/2023: Hb 17.9 Cr 1.04 Trop HS 5, 5 TSH <0.005  Event monitor 30 days 06/20/2023 - 07/19/2023: Dominant rhythm: Sinus. HR 50-140 bpm. Avg HR 74 bpm in sinus rhythm. <1%  PAC. PVCs noted No significant arrhythmia noted.    Physical Exam Vitals and nursing note reviewed.  Constitutional:      General: He is not in acute distress. Neck:     Vascular: No JVD.   Cardiovascular:     Rate and Rhythm: Normal rate and regular rhythm.     Heart sounds: Normal heart sounds. No murmur heard. Pulmonary:     Effort: Pulmonary effort is normal.     Breath sounds: Normal breath sounds. No wheezing or rales.   Musculoskeletal:     Right lower leg: No edema.     Left lower leg: No edema.      VISIT DIAGNOSES:   ICD-10-CM   1. Precordial pain  R07.2 EXERCISE TOLERANCE TEST (ETT)    ECHOCARDIOGRAM COMPLETE    CT CARDIAC SCORING (SELF PAY ONLY)    CANCELED: CT CARDIAC SCORING (SELF PAY ONLY)    2. Postural dizziness with presyncope  R42    R55     3. Graves disease  E05.00        Jeremy [name] is a 43 y.o. male with Graves' disease, chest pain Assessment & Plan Chest pain: Chest tightness, sometimes with exertion.  No significant risk factors.  Recommend exercise treadmill stress test, echocardiogram, calcium score scan.    Hyperthyroidism, Graves' disease: Continue methimazole  as per endocrinology.  Presyncope: Occurred in March when he was diagnosed with Graves' disease.  No recurrence since being on methimazole .  No further workup  needed at this time.   Informed Consent   Shared Decision Making/Informed Consent The risks [chest pain, shortness of breath, cardiac arrhythmias, dizziness, blood pressure fluctuations, myocardial infarction, stroke/transient ischemic attack, and life-threatening complications (estimated to be 1 in 10,000)], benefits (risk stratification, diagnosing coronary artery disease, treatment guidance) and alternatives of an exercise tolerance test were discussed in detail with Jeremy [name] and he agrees to proceed.        F/u as needed  Signed, Newman JINNY Lawrence, MD

## 2023-09-02 NOTE — Patient Instructions (Signed)
 Medication Instructions:  Your physician recommends that you continue on your current medications as directed. Please refer to the Current Medication list given to you today.  *If you need a refill on your cardiac medications before your next appointment, please call your pharmacy*  Lab Work: None ordered.  If you have labs (blood work) drawn today and your tests are completely normal, you will receive your results only by: MyChart Message (if you have MyChart) OR A paper copy in the mail If you have any lab test that is abnormal or we need to change your treatment, we will call you to review the results.  Testing/Procedures: Your physician has requested that you have an echocardiogram. Echocardiography is a painless test that uses sound waves to create images of your heart. It provides your doctor with information about the size and shape of your heart and how well your heart's chambers and valves are working. This procedure takes approximately one hour. There are no restrictions for this procedure. Please do NOT wear cologne, perfume, aftershave, or lotions (deodorant is allowed). Please arrive 15 minutes prior to your appointment time.  Please note: We ask at that you not bring children with you during ultrasound (echo/ vascular) testing. Due to room size and safety concerns, children are not allowed in the ultrasound rooms during exams. Our front office staff cannot provide observation of children in our lobby area while testing is being conducted. An adult accompanying a patient to their appointment will only be allowed in the ultrasound room at the discretion of the ultrasound technician under special circumstances. We apologize for any inconvenience.   Your physician has requested that you have an exercise tolerance test. For further information please visit [email or web address] Please also follow instruction sheet, as given.   CALCIUM SCORE CT Your physician has requested that you have  cardiac CT. Cardiac computed tomography (CT) is a painless test that uses an x-ray machine to take clear, detailed pictures of your heart. For further information please visit [email or web address] Please follow instruction sheet as given.    Follow-Up: At [facility], you and your health needs are our priority.  As part of our continuing mission to provide you with exceptional heart care, our providers are all part of one team.  This team includes your primary Cardiologist (physician) and Advanced Practice Providers or APPs (Physician Assistants and Nurse Practitioners) who all work together to provide you with the care you need, when you need it.  Your next appointment:   Follow up as needed  We recommend signing up for the patient portal called MyChart.  Sign up information is provided on this After Visit Summary.  MyChart is used to connect with patients for Virtual Visits (Telemedicine).  Patients are able to view lab/test results, encounter notes, upcoming appointments, etc.  Non-urgent messages can be sent to your provider as well.   To learn more about what you can do with MyChart, go to [email or web address].

## 2023-09-19 ENCOUNTER — Other Ambulatory Visit (HOSPITAL_BASED_OUTPATIENT_CLINIC_OR_DEPARTMENT_OTHER)

## 2023-09-21 ENCOUNTER — Ambulatory Visit (HOSPITAL_BASED_OUTPATIENT_CLINIC_OR_DEPARTMENT_OTHER): Admission: RE | Admit: 2023-09-21 | Source: Ambulatory Visit

## 2023-09-22 ENCOUNTER — Ambulatory Visit (HOSPITAL_BASED_OUTPATIENT_CLINIC_OR_DEPARTMENT_OTHER)
Admission: RE | Admit: 2023-09-22 | Discharge: 2023-09-22 | Disposition: A | Discharge status: Home / Self Care | Payer: Self-pay | Source: Ambulatory Visit | Attending: Cardiology | Admitting: Cardiology

## 2023-09-22 DIAGNOSIS — R072 Precordial pain: Secondary | ICD-10-CM | POA: Insufficient documentation

## 2023-09-25 ENCOUNTER — Telehealth (HOSPITAL_COMMUNITY): Payer: Self-pay | Admitting: *Deleted

## 2023-09-25 ENCOUNTER — Ambulatory Visit: Payer: Self-pay | Admitting: Cardiology

## 2023-09-25 NOTE — Telephone Encounter (Signed)
 Left a message on voice mail regarding his ETT on 10/02/23 at 1:45.

## 2023-09-30 DIAGNOSIS — A630 Anogenital (venereal) warts: Secondary | ICD-10-CM | POA: Diagnosis not present

## 2023-10-01 ENCOUNTER — Other Ambulatory Visit: Payer: Self-pay

## 2023-10-01 DIAGNOSIS — R072 Precordial pain: Secondary | ICD-10-CM

## 2023-10-01 NOTE — Progress Notes (Signed)
 Pt needs consent signed for ETT

## 2023-10-02 ENCOUNTER — Ambulatory Visit (HOSPITAL_COMMUNITY)
Admission: RE | Admit: 2023-10-02 | Discharge: 2023-10-02 | Disposition: A | Discharge status: Home / Self Care | Source: Ambulatory Visit | Attending: Cardiovascular Disease | Admitting: Cardiovascular Disease

## 2023-10-02 DIAGNOSIS — R072 Precordial pain: Secondary | ICD-10-CM | POA: Insufficient documentation

## 2023-10-02 LAB — EXERCISE TOLERANCE TEST
Angina Index: 0
Duke Treadmill Score: 8
Estimated workload: 9.5
Exercise duration (min): 7 min
Exercise duration (sec): 40 s
MPHR: 176 {beats}/min
Peak HR: 150 {beats}/min
Percent HR: 85 %
Rest HR: 58 {beats}/min
ST Depression (mm): 0 mm

## 2023-10-17 ENCOUNTER — Other Ambulatory Visit (HOSPITAL_COMMUNITY)

## 2023-10-27 ENCOUNTER — Ambulatory Visit (HOSPITAL_COMMUNITY)
Admission: RE | Admit: 2023-10-27 | Discharge: 2023-10-27 | Disposition: A | Discharge status: Home / Self Care | Source: Ambulatory Visit | Attending: Cardiology | Admitting: Cardiology

## 2023-10-27 DIAGNOSIS — R072 Precordial pain: Secondary | ICD-10-CM | POA: Insufficient documentation

## 2023-10-27 LAB — ECHOCARDIOGRAM COMPLETE
Area-P 1/2: 3.26 cm2
S' Lateral: 2.9 cm

## 2023-10-28 DIAGNOSIS — A630 Anogenital (venereal) warts: Secondary | ICD-10-CM | POA: Diagnosis not present

## 2023-11-07 DIAGNOSIS — R350 Frequency of micturition: Secondary | ICD-10-CM | POA: Diagnosis not present

## 2023-11-07 DIAGNOSIS — Z113 Encounter for screening for infections with a predominantly sexual mode of transmission: Secondary | ICD-10-CM | POA: Diagnosis not present

## 2023-11-18 DIAGNOSIS — A630 Anogenital (venereal) warts: Secondary | ICD-10-CM | POA: Diagnosis not present

## 2023-11-28 ENCOUNTER — Other Ambulatory Visit

## 2023-11-28 DIAGNOSIS — E0500 Thyrotoxicosis with diffuse goiter without thyrotoxic crisis or storm: Secondary | ICD-10-CM | POA: Diagnosis not present

## 2023-11-29 LAB — T3, FREE: T3, Free: 2.8 pg/mL (ref 2.3–4.2)

## 2023-11-29 LAB — T4, FREE: Free T4: 0.6 ng/dL — ABNORMAL LOW (ref 0.8–1.8)

## 2023-11-29 LAB — TSH: TSH: 16.29 m[IU]/L — ABNORMAL HIGH (ref 0.40–4.50)

## 2023-12-01 ENCOUNTER — Ambulatory Visit: Payer: Self-pay | Admitting: Endocrinology

## 2023-12-03 ENCOUNTER — Encounter: Payer: Self-pay | Admitting: Endocrinology

## 2023-12-03 ENCOUNTER — Ambulatory Visit (INDEPENDENT_AMBULATORY_CARE_PROVIDER_SITE_OTHER): Admitting: Endocrinology

## 2023-12-03 VITALS — BP 116/78 | HR 63 | Resp 20 | Ht 70.0 in | Wt 221.2 lb

## 2023-12-03 DIAGNOSIS — E0500 Thyrotoxicosis with diffuse goiter without thyrotoxic crisis or storm: Secondary | ICD-10-CM | POA: Diagnosis not present

## 2023-12-03 DIAGNOSIS — E0590 Thyrotoxicosis, unspecified without thyrotoxic crisis or storm: Secondary | ICD-10-CM

## 2023-12-03 MED ORDER — METHIMAZOLE 5 MG PO TABS: 5.0000 mg | ORAL_TABLET | Freq: Every day | ORAL | 3 refills | Status: DC

## 2023-12-03 NOTE — Progress Notes (Signed)
 Outpatient Endocrinology Note [place] Necha Harries, MD   Patient's Name: Jeremy [name]    DOB: March 13, 1980    MRN: [id number]  REASON OF VISIT: Follow-up for hyperthyroidism / Graves' disease  REFERRING PROVIDER: Margaret Eduard SAUNDERS, MD  PCP: Emilio Joesph DEL, PA-C  HISTORY OF PRESENT ILLNESS:   Jeremy [name] is a 43 y.o. old male with past medical history as listed below is presented for follow-up for hyperthyroidism / Graves' disease.   Pertinent Thyroid  History: Patient has hyperthyroidism due to Graves' disease, on methimazole .  Background: Patient was referred to endocrinology for further evaluation and management of newly diagnosed Graves' disease, initial consult was in May 2025. Patient had symptoms of off-balance, lightheadedness, starting in March 2025, had ER visits had MRI and CT scan without contrast with no acute findings, later evaluated by neurology and had thyroid  function test on June 16, 2023 showed suppressed TSH with elevated free T4 3.12.  Patient has seen primary care provider on April 25 and had thyroid  function test in outside facility [facility] reviewed lab results from care everywhere showed suppressed TSH with elevated free T4 of 2.27 and elevated free T3 of 9.6 consistent with hyperthyroidism.  Patient had symptoms of weight loss, palpitation, heat intolerance, increased sweating and hand tremor.  Family history of hyperthyroidism/Graves' disease in mother.  -In May 2025 : Had elevated TSI and TRAb consistent with Graves' disease causing hyperthyroidism.   Latest Reference Range & Units 07/11/23 11:47  TSI <140 % baseline 231 (H)  TRAB <=2.00 IU/L 8.34 (H)  (H): Data is abnormally high  -Patient was started on methimazole  15 mg daily in the beginning of May 2025.  - June 2025 methimazole  was decreased to 10 mg daily.  Interval history: Patient was taking methimazole  10 mg daily until recent lab for thyroid  function test.  He has elevated TSH of 16.  He has  felt increased discomfort in the neck and pressure otherwise no new symptoms.  Denies any recent illness or fever.  No GI upset.  No other complaints today.  Body weight is relatively stable.  Heart rate today 63.   Latest Reference Range & Units 11/28/23 11:01  TSH 0.40 - 4.50 mIU/L 16.29 (H)  Triiodothyronine,Free,Serum 2.3 - 4.2 pg/mL 2.8  T4,Free(Direct) 0.8 - 1.8 ng/dL 0.6 (L)  (H): Data is abnormally high (L): Data is abnormally low  REVIEW OF SYSTEMS:  As per history of present illness.   PAST MEDICAL HISTORY: Past Medical History:  Diagnosis Date   Allergy    Anxiety    GERD (gastroesophageal reflux disease)    Vertigo     PAST SURGICAL HISTORY: Past Surgical History:  Procedure Laterality Date   EYE SURGERY     FRACTURE SURGERY     NECK SURGERY     TONSILLECTOMY      ALLERGIES: No Known Allergies  FAMILY HISTORY:  Family History  Problem Relation Age of Onset   Thyroid  disease Mother    Hyperlipidemia Mother    Stroke Father    Stroke Maternal Grandmother     SOCIAL HISTORY: Social History   Socioeconomic History   Marital status: Media planner    Spouse name: Not on file   Number of children: Not on file   Years of education: Not on file   Highest education level: Not on file  Occupational History   Not on file  Tobacco Use   Smoking status: Never   Smokeless tobacco: Never  Vaping Use   Vaping  status: Never Used  Substance and Sexual Activity   Alcohol use: Yes    Alcohol/week: 1.0 - 2.0 standard drink of alcohol    Types: 1 - 2 Standard drinks or equivalent per week   Drug use: No   Sexual activity: Yes  Other Topics Concern   Not on file  Social History Narrative   Not on file   Social Drivers of Health   Financial Resource Strain: High Risk (03/25/2023)   Received from [facility]   Overall Financial Resource Strain (CARDIA)    Difficulty of Paying Living Expenses: Hard  Food Insecurity: Food Insecurity Present (03/25/2023)    Received from [facility]   Hunger Vital Sign    Within the past 12 months, you worried that your food would run out before you got the money to buy more.: Sometimes true    Within the past 12 months, the food you bought just didn't last and you didn't have money to get more.: Sometimes true  Transportation Needs: Unmet Transportation Needs (03/25/2023)   Received from [facility] - Transportation    Lack of Transportation (Medical): Yes    Lack of Transportation (Non-Medical): Yes  Physical Activity: Sufficiently Active (03/25/2023)   Received [facility]   Exercise Vital Sign    On average, how many days per week do you engage in moderate to strenuous exercise (like a brisk walk)?: 3 days    On average, how many minutes do you engage in exercise at this level?: 60 min  Stress: Stress Concern Present (03/25/2023)   Received from [facility] - Occupational Stress Questionnaire    Feeling of Stress : Rather much  Social Connections: Somewhat Isolated (03/25/2023)   Received from [facility]   Social Network    How would you rate your social network (family, work, friends)?: Restricted participation with some degree of social isolation    MEDICATIONS:  Current Outpatient Medications  Medication Sig Dispense Refill   pantoprazole  (PROTONIX ) 40 MG tablet Take 40 mg by mouth daily.     methimazole  (TAPAZOLE ) 5 MG tablet Take 1 tablet (5 mg total) by mouth daily. 90 tablet 3   No current facility-administered medications for this visit.    PHYSICAL EXAM: Vitals:   12/03/23 1102  BP: 116/78  Pulse: 63  Resp: 20  SpO2: 96%  Weight: 221 lb 3.2 oz (100.3 kg)  Height: 5' 10 (1.778 m)   Body mass index is 31.74 kg/m.  Wt Readings from Last 3 Encounters:  12/03/23 221 lb 3.2 oz (100.3 kg)  09/02/23 223 lb 6.4 oz (101.3 kg)  08/26/23 221 lb 3.2 oz (100.3 kg)    General: Well developed, well nourished male in no  apparent distress.  HEENT: AT/[place], no external lesions. Hearing intact to the spoken word Eyes: EOMI. No stare, proptosis or lid lag. Conjunctiva clear and no icterus. No erythema or watering Neck: Trachea midline, neck supple with mild appreciable thyromegaly or no lymphadenopathy and no palpable thyroid  nodules Lungs: Clear to auscultation, no wheeze. Respirations not labored Heart: S1S2, Regular in rate and rhythm. No loud murmurs Abdomen: Soft, non tender, non distended Neurologic: Alert, oriented, normal speech, deep tendon biceps reflexes 1-2+,  no gross focal neurological deficit Extremities: No pedal pitting edema, mild tremors of outstretched hands Skin: Warm, color good.  Psychiatric: Does not appear depressed or anxious  PERTINENT HISTORIC LABORATORY AND IMAGING STUDIES:  All pertinent laboratory results were  reviewed. Please see HPI also for further details.   TSH  Date Value Ref Range Status  11/28/2023 16.29 (H) 0.40 - 4.50 mIU/L Final  06/16/2023 <0.005 (L) 0.450 - 4.500 uIU/mL Final  08/12/2013 0.60 0.35 - 4.50 uIU/mL Final    Lab Results  Component Value Date   FREET4 0.6 (L) 11/28/2023   FREET4 0.9 08/19/2023   T3FREE 2.8 11/28/2023   T3FREE 2.9 08/19/2023   TSH 16.29 (H) 11/28/2023   TSH <0.005 (L) 06/16/2023   TSH 0.60 08/12/2013    No results found for: THYROTRECAB  Lab Results  Component Value Date   TSH 16.29 (H) 11/28/2023   TSH <0.005 (L) 06/16/2023   TSH 0.60 08/12/2013   FREET4 0.6 (L) 11/28/2023   FREET4 0.9 08/19/2023     Lab Results  Component Value Date   TSI 231 (H) 07/11/2023     No components found for: TRAB       07/04/2023 07/04/2023 07/04/2023        T3, Free 9.6 High    -- --  TSH -- <0.005 Low    --  T4, Free (Direct) -- -- 2.27 High        ASSESSMENT / PLAN  1. Hyperthyroidism   2. Graves disease      -Patient has hyperthyroidism secondary to Graves' disease has positive TRAb and TSI.  He has been on  methimazole  since May 2025, initial dose was 15 mg daily.  Patient was taking methimazole  10 mg daily until recent lab.  He has elevated TSH of 16 this time.    He has been requiring low-dose of methimazole .  Discussed that will stay on antithyroid medication at this time.  Potential spontaneous resolution of Graves' disease can happen over time.  Plan: -Do not take methimazole  for next 5 days and restart methimazole  5 mg daily. - He needs close endocrinology follow-up. - Check thyroid  function test in 6-7 weeks prior to follow-up visit.   Diagnoses and all orders for this visit:  Hyperthyroidism  Graves disease -     methimazole  (TAPAZOLE ) 5 MG tablet; Take 1 tablet (5 mg total) by mouth daily. -     T4, free -     TSH -     T3, free     DISPOSITION Follow up in clinic in 6-7 suggested.  All questions answered and patient verbalized understanding of the plan.  [place] Lydian Chavous, MD [facility] Endocrinology [facility] [address] [place], [address] [place], [place] [address] Phone # [phone]   At least part of this note was generated using voice recognition software. Inadvertent word errors may have occurred, which were not recognized during the proofreading process.

## 2023-12-17 ENCOUNTER — Encounter: Payer: Self-pay | Admitting: Endocrinology

## 2023-12-18 NOTE — Telephone Encounter (Signed)
 I would think it could be coincidence, I would recommend to continue on methimazole  if the neck problem worsen please let me know.

## 2024-01-01 ENCOUNTER — Ambulatory Visit (HOSPITAL_COMMUNITY): Admission: EM | Admit: 2024-01-01 | Discharge: 2024-01-01 | Disposition: A | Discharge status: Home / Self Care

## 2024-01-01 ENCOUNTER — Encounter (HOSPITAL_COMMUNITY): Payer: Self-pay | Admitting: Emergency Medicine

## 2024-01-01 DIAGNOSIS — G44201 Tension-type headache, unspecified, intractable: Secondary | ICD-10-CM | POA: Diagnosis not present

## 2024-01-01 MED ORDER — KETOROLAC TROMETHAMINE 30 MG/ML IJ SOLN
INTRAMUSCULAR | Status: AC
  Filled 2024-01-01: qty 1

## 2024-01-01 MED ORDER — PROMETHAZINE HCL 25 MG/ML IJ SOLN: 25.0000 mg | Freq: Four times a day (QID) | INTRAMUSCULAR | Status: DC | PRN

## 2024-01-01 MED ORDER — CYCLOBENZAPRINE HCL 5 MG PO TABS: 5.0000 mg | ORAL_TABLET | Freq: Three times a day (TID) | ORAL | 0 refills | Status: AC | PRN

## 2024-01-01 MED ORDER — DIPHENHYDRAMINE HCL 50 MG/ML IJ SOLN
INTRAMUSCULAR | Status: AC
Start: 2024-01-01 — End: 2024-01-01
  Filled 2024-01-01: qty 1

## 2024-01-01 MED ORDER — KETOROLAC TROMETHAMINE 30 MG/ML IJ SOLN
30.0000 mg | Freq: Once | INTRAMUSCULAR | Status: AC
  Administered 2024-01-01: 30 mg via INTRAMUSCULAR

## 2024-01-01 MED ORDER — DEXAMETHASONE SOD PHOSPHATE PF 10 MG/ML IJ SOLN
10.0000 mg | Freq: Once | INTRAMUSCULAR | Status: AC
  Administered 2024-01-01: 10 mg via INTRAMUSCULAR

## 2024-01-01 MED ORDER — DIPHENHYDRAMINE HCL 50 MG/ML IJ SOLN
25.0000 mg | Freq: Once | INTRAMUSCULAR | Status: AC
  Administered 2024-01-01: 25 mg via INTRAMUSCULAR

## 2024-01-01 NOTE — ED Provider Notes (Signed)
 [facility]    CSN: [id number] Arrival date & time: 01/01/24  1542      History   Chief Complaint Chief Complaint  Patient presents with   Headache    HPI Jeremy [name] is a 43 y.o. male.   Patient with a history of hyperthyroidism that is treated with methimazole , presents today due to headache since yesterday.  Patient states that he skipped his dose of methimazole  yesterday and believes that may be the cause of his headache.  Patient states that he worked all day and did not have a headache until he got home around 3 AM.  Patient states that it was a throbbing headache that came on all of a sudden with a tightness in his neck.  Patient took a shower and lay down and took no medicine last night, states that at that time his headache was a 9/10.  Patient states he woke up and still has a headache that was throbbing with tightness in his neck.  Patient states that he took 400 mg ibuprofen  around 12 PM and states that headache went down to about 6-7 out of 10 but has not resolved.  Patient denies blurred vision, dizziness, nausea, weakness on one side of the body, upper back pain, numbness or tingling on one side of the body.  Patient states that this is the worst headache of his life but also states that before he was diagnosed with hyperthyroidism he experienced similar symptoms that is why he believes his symptoms may be caused by missing 1 dose of medicine.   Headache   Past Medical History:  Diagnosis Date   Allergy    Anxiety    GERD (gastroesophageal reflux disease)    Vertigo     Patient Active Problem List   Diagnosis Date Noted   Precordial pain 09/02/2023   Postural dizziness with presyncope 09/02/2023   Graves disease 09/02/2023   GERD (gastroesophageal reflux disease) 08/04/2014    Past Surgical History:  Procedure Laterality Date   EYE SURGERY     FRACTURE SURGERY     NECK SURGERY     TONSILLECTOMY         Home Medications    Prior to  Admission medications   Medication Sig Start Date End Date Taking? Authorizing Provider  methimazole  (TAPAZOLE ) 5 MG tablet Take 1 tablet (5 mg total) by mouth daily. 12/03/23 12/02/24  [place], [place], MD  pantoprazole  (PROTONIX ) 40 MG tablet Take 40 mg by mouth daily.    [provider]    Family History Family History  Problem Relation Age of Onset   Thyroid  disease Mother    Hyperlipidemia Mother    Stroke Father    Stroke Maternal Grandmother     Social History Social History   Tobacco Use   Smoking status: Never   Smokeless tobacco: Never  Vaping Use   Vaping status: Never Used  Substance Use Topics   Alcohol use: Yes    Alcohol/week: 1.0 - 2.0 standard drink of alcohol    Types: 1 - 2 Standard drinks or equivalent per week   Drug use: No     Allergies   Patient has no known allergies.   Review of Systems Review of Systems  Neurological:  Positive for headaches.     Physical Exam Triage Vital Signs ED Triage Vitals  Encounter Vitals Group     BP 01/01/24 1641 111/73     Girls Systolic BP Percentile --  Girls Diastolic BP Percentile --      Boys Systolic BP Percentile --      Boys Diastolic BP Percentile --      Pulse Rate 01/01/24 1641 60     Resp 01/01/24 1641 13     Temp 01/01/24 1641 98.6 F (37 C)     Temp Source 01/01/24 1641 Oral     SpO2 01/01/24 1641 97 %     Weight --      Height --      Head Circumference --      Peak Flow --      Pain Score 01/01/24 1640 9     Pain Loc --      Pain Education --      Exclude from Growth Chart --    No data found.  Updated Vital Signs BP 111/73 (BP Location: Right Arm)   Pulse 60   Temp 98.6 F (37 C) (Oral)   Resp 13   SpO2 97%   Visual Acuity Right Eye Distance:   Left Eye Distance:   Bilateral Distance:    Right Eye Near:   Left Eye Near:    Bilateral Near:     Physical Exam   UC Treatments / Results  Labs (all labs ordered are listed, but only abnormal results are  displayed) Labs Reviewed - No data to display  EKG   Radiology No results found.  Procedures Procedures (including critical care time)  Medications Ordered in UC Medications  ketorolac  (TORADOL ) 30 MG/ML injection 30 mg (has no administration in time range)  dexamethasone (DECADRON) injection 10 mg (has no administration in time range)  promethazine (PHENERGAN) injection 25 mg (has no administration in time range)  diphenhydrAMINE (BENADRYL) injection 25 mg (has no administration in time range)    Initial Impression / Assessment and Plan / UC Course  I have reviewed the triage vital signs and the nursing notes.  Pertinent labs & imaging results that were available during my care of the patient were reviewed by me and considered in my medical decision making (see chart for details).     Patient experienced relief of headache with medications given in office.  Patient rates his pain a 3/10 now.  Patient was given a muscle relaxer to take home to be taken in addition to ibuprofen .  He was also given strict ER precautions. Final Clinical Impressions(s) / UC Diagnoses   Final diagnoses:  Acute intractable tension-type headache   Discharge Instructions   None    ED Prescriptions   None    PDMP not reviewed this encounter.   Andra Corean BROCKS, PA-C 01/01/24 [phone]

## 2024-01-01 NOTE — ED Triage Notes (Signed)
 Pt c/o head ache and tightness in neck since yesterday when got off work. Took 2 ibuprofen  200 mg that didn't really help.

## 2024-01-01 NOTE — Discharge Instructions (Signed)
 He may take ibuprofen  800 mg every 8 hours as needed for pain in addition to muscle relaxers.  If you experience worsening head pain, pounding, sudden onset thunderclap headache, blurred vision, nausea, dizziness, unilateral weakness that is a reason to report to the ER immediately.

## 2024-01-16 ENCOUNTER — Other Ambulatory Visit

## 2024-01-16 DIAGNOSIS — E0500 Thyrotoxicosis with diffuse goiter without thyrotoxic crisis or storm: Secondary | ICD-10-CM | POA: Diagnosis not present

## 2024-01-17 ENCOUNTER — Ambulatory Visit: Payer: Self-pay | Admitting: Endocrinology

## 2024-01-17 LAB — T4, FREE: Free T4: 0.8 ng/dL (ref 0.8–1.8)

## 2024-01-17 LAB — T3, FREE: T3, Free: 2.9 pg/mL (ref 2.3–4.2)

## 2024-01-17 LAB — TSH: TSH: 4.9 m[IU]/L — ABNORMAL HIGH (ref 0.40–4.50)

## 2024-01-21 ENCOUNTER — Encounter: Payer: Self-pay | Admitting: Endocrinology

## 2024-01-21 ENCOUNTER — Ambulatory Visit (INDEPENDENT_AMBULATORY_CARE_PROVIDER_SITE_OTHER): Admitting: Endocrinology

## 2024-01-21 VITALS — BP 90/60 | HR 60 | Resp 16 | Ht 70.0 in | Wt 213.2 lb

## 2024-01-21 DIAGNOSIS — E0500 Thyrotoxicosis with diffuse goiter without thyrotoxic crisis or storm: Secondary | ICD-10-CM

## 2024-01-21 MED ORDER — METHIMAZOLE 5 MG PO TABS: 2.5000 mg | ORAL_TABLET | Freq: Every day | ORAL | 3 refills | Status: AC

## 2024-01-21 NOTE — Progress Notes (Signed)
 Outpatient Endocrinology Note Jeremy Peralta, MD   Patient's Name: Jeremy [name]    DOB: 1981-01-30    MRN: [id number]  REASON OF VISIT: Follow-up for hyperthyroidism / Graves' disease  REFERRING PROVIDER: Margaret Eduard SAUNDERS, MD  PCP: Emilio Joesph DEL, PA-C  HISTORY OF PRESENT ILLNESS:   Jeremy [name] is a 42 y.o. old male with past medical history as listed below is presented for follow-up for hyperthyroidism / Graves' disease.   Pertinent Thyroid  History: Patient has hyperthyroidism due to Graves' disease, on methimazole .  Background: Patient was referred to endocrinology for further evaluation and management of newly diagnosed Graves' disease, initial consult was in May 2025.  Background : patient had symptoms of off-balance, lightheadedness, starting in March 2025, had ER visits had MRI and CT scan without contrast with no acute findings, later evaluated by neurology and had thyroid  function test on June 16, 2023 showed suppressed TSH with elevated free T4 3.12.  Patient has seen primary care provider on April 25 and had thyroid  function test in outside facility [facility] reviewed lab results from care everywhere showed suppressed TSH with elevated free T4 of 2.27 and elevated free T3 of 9.6 consistent with hyperthyroidism.  Patient had symptoms of weight loss, palpitation, heat intolerance, increased sweating and hand tremor.  Family history of hyperthyroidism/Graves' disease in mother.  -In May 2025 : Had elevated TSI and TRAb consistent with Graves' disease causing hyperthyroidism.   Latest Reference Range & Units 07/11/23 11:47  TSI <140 % baseline 231 (H)  TRAB <=2.00 IU/L 8.34 (H)  (H): Data is abnormally high  -Patient was started on methimazole  15 mg daily in the beginning of May 2025.  - June 2025 methimazole  was decreased to 10 mg daily, then decreased to 5 mg daily in September 2025.  Interval history: Patient is currently taking methimazole  5 mg daily.  He  rarely has off balance otherwise no new complaints.  No more neck discomfort or neck pressure.  Recent thyroid  function test still with mildly elevated TSH and normal free T4 and free T3.  No GI issues, no recent illness.  No other complaints today.   Latest Reference Range & Units 01/16/24 09:33  TSH 0.40 - 4.50 mIU/L 4.90 (H)  Triiodothyronine,Free,Serum 2.3 - 4.2 pg/mL 2.9  T4,Free(Direct) 0.8 - 1.8 ng/dL 0.8  (H): Data is abnormally high  REVIEW OF SYSTEMS:  As per history of present illness.   PAST MEDICAL HISTORY: Past Medical History:  Diagnosis Date   Allergy    Anxiety    GERD (gastroesophageal reflux disease)    Vertigo     PAST SURGICAL HISTORY: Past Surgical History:  Procedure Laterality Date   EYE SURGERY     FRACTURE SURGERY     NECK SURGERY     TONSILLECTOMY      ALLERGIES: No Known Allergies  FAMILY HISTORY:  Family History  Problem Relation Age of Onset   Thyroid  disease Mother    Hyperlipidemia Mother    Stroke Father    Stroke Maternal Grandmother     SOCIAL HISTORY: Social History   Socioeconomic History   Marital status: Media Planner    Spouse name: Not on file   Number of children: Not on file   Years of education: Not on file   Highest education level: Not on file  Occupational History   Not on file  Tobacco Use   Smoking status: Never   Smokeless tobacco: Never  Vaping Use   Vaping status: Never  Used  Substance and Sexual Activity   Alcohol use: Yes    Alcohol/week: 1.0 - 2.0 standard drink of alcohol    Types: 1 - 2 Standard drinks or equivalent per week   Drug use: No   Sexual activity: Yes  Other Topics Concern   Not on file  Social History Narrative   Not on file   Social Drivers of Health   Financial Resource Strain: High Risk (03/25/2023)   Received from [facility]   Overall Financial Resource Strain (CARDIA)    Difficulty of Paying Living Expenses: Hard  Food Insecurity: Food Insecurity Present (03/25/2023)    Received from [facility]   Hunger Vital Sign    Within the past 12 months, you worried that your food would run out before you got the money to buy more.: Sometimes true    Within the past 12 months, the food you bought just didn't last and you didn't have money to get more.: Sometimes true  Transportation Needs: Unmet Transportation Needs (03/25/2023)   Received from [facility] - Transportation    Lack of Transportation (Medical): Yes    Lack of Transportation (Non-Medical): Yes  Physical Activity: Sufficiently Active (03/25/2023)   Received from [facility]   Exercise Vital Sign    On average, how many days per week do you engage in moderate to strenuous exercise (like a brisk walk)?: 3 days    On average, how many minutes do you engage in exercise at this level?: 60 min  Stress: Stress Concern Present (03/25/2023)   Received from [facility] - Occupational Stress Questionnaire    Feeling of Stress : Rather much  Social Connections: Somewhat Isolated (03/25/2023)   Received from [facility]   Social Network    How would you rate your social network (family, work, friends)?: Restricted participation with some degree of social isolation    MEDICATIONS:  Current Outpatient Medications  Medication Sig Dispense Refill   cyclobenzaprine  (FLEXERIL ) 5 MG tablet Take 1 tablet (5 mg total) by mouth 3 (three) times daily as needed for muscle spasms. 30 tablet 0   pantoprazole  (PROTONIX ) 40 MG tablet Take 40 mg by mouth daily.     methimazole  (TAPAZOLE ) 5 MG tablet Take 0.5 tablets (2.5 mg total) by mouth daily. 45 tablet 3   No current facility-administered medications for this visit.    PHYSICAL EXAM: Vitals:   01/21/24 0922  BP: 90/60  Pulse: 60  Resp: 16  SpO2: 99%  Weight: 213 lb 3.2 oz (96.7 kg)  Height: 5' 10 (1.778 m)    Body mass index is 30.59 kg/m.  Wt Readings from Last 3 Encounters:  01/21/24 213 lb 3.2  oz (96.7 kg)  12/03/23 221 lb 3.2 oz (100.3 kg)  09/02/23 223 lb 6.4 oz (101.3 kg)    General: Well developed, well nourished male in no apparent distress.  HEENT: AT/[place], no external lesions. Hearing intact to the spoken word Eyes: EOMI. No stare, proptosis or lid lag. Conjunctiva clear and no icterus. No erythema or watering Neck: Trachea midline, neck supple with mild appreciable thyromegaly or no lymphadenopathy and no palpable thyroid  nodules Lungs: Clear to auscultation, no wheeze. Respirations not labored Heart: S1S2, Regular in rate and rhythm. No loud murmurs Abdomen: Soft, non tender, non distended Neurologic: Alert, oriented, normal speech, deep tendon biceps reflexes 2+,  no gross focal neurological deficit Extremities: No pedal pitting edema, no tremors of outstretched  hands Skin: Warm, color good.  Psychiatric: Does not appear depressed or anxious  PERTINENT HISTORIC LABORATORY AND IMAGING STUDIES:  All pertinent laboratory results were reviewed. Please see HPI also for further details.   TSH  Date Value Ref Range Status  01/16/2024 4.90 (H) 0.40 - 4.50 mIU/L Final  11/28/2023 16.29 (H) 0.40 - 4.50 mIU/L Final  06/16/2023 <0.005 (L) 0.450 - 4.500 uIU/mL Final    Lab Results  Component Value Date   FREET4 0.8 01/16/2024   FREET4 0.6 (L) 11/28/2023   FREET4 0.9 08/19/2023   T3FREE 2.9 01/16/2024   T3FREE 2.8 11/28/2023   T3FREE 2.9 08/19/2023   TSH 4.90 (H) 01/16/2024   TSH 16.29 (H) 11/28/2023   TSH <0.005 (L) 06/16/2023    No results found for: THYROTRECAB  Lab Results  Component Value Date   TSH 4.90 (H) 01/16/2024   TSH 16.29 (H) 11/28/2023   TSH <0.005 (L) 06/16/2023   FREET4 0.8 01/16/2024   FREET4 0.6 (L) 11/28/2023   FREET4 0.9 08/19/2023     Lab Results  Component Value Date   TSI 231 (H) 07/11/2023     No components found for: TRAB       07/04/2023 07/04/2023 07/04/2023        T3, Free 9.6 High    -- --  TSH -- <0.005 Low    --   T4, Free (Direct) -- -- 2.27 High        ASSESSMENT / PLAN  1. Graves disease     -Patient has hyperthyroidism secondary to Graves' disease has positive TRAb and TSI.  He has been on methimazole  since May 2025, initial dose was 15 mg daily.  Patient was taking methimazole  10 mg daily and TSH of 16 in September, 2025 and dose decreased to 5 mg daily.  He is currently taking methimazole  5 mg daily.  He has been requiring low-dose of methimazole .  Discussed that will stay on antithyroid medication at this time.  Potential spontaneous resolution of Graves' disease can happen over time.  Plan: -Patient labs still with mild elevated TSH, decrease methimazole  to 2.5 mg daily. -He needs close endocrinology follow-up. -Check thyroid  function test prior to follow-up visit in 2 months. -I would also like to check TRAb will help to decide holding methimazole  in the future.   Diagnoses and all orders for this visit:  Graves disease -     methimazole  (TAPAZOLE ) 5 MG tablet; Take 0.5 tablets (2.5 mg total) by mouth daily. -     T4, free -     T3, free -     TSH -     TRAb (TSH Receptor Binding Antibody)      DISPOSITION Follow up in clinic in 2 months suggested.  All questions answered and patient verbalized understanding of the plan.  Spike Desilets, MD [facility] Endocrinology [facility] [address] [place], [address] [place], [place] [address] Phone # [phone]   At least part of this note was generated using voice recognition software. Inadvertent word errors may have occurred, which were not recognized during the proofreading process.

## 2024-02-13 ENCOUNTER — Encounter: Payer: Self-pay | Admitting: Endocrinology

## 2024-02-13 DIAGNOSIS — E0500 Thyrotoxicosis with diffuse goiter without thyrotoxic crisis or storm: Secondary | ICD-10-CM

## 2024-02-16 ENCOUNTER — Other Ambulatory Visit

## 2024-02-16 ENCOUNTER — Ambulatory Visit (INDEPENDENT_AMBULATORY_CARE_PROVIDER_SITE_OTHER): Admitting: Endocrinology

## 2024-02-16 ENCOUNTER — Encounter: Payer: Self-pay | Admitting: Endocrinology

## 2024-02-16 VITALS — BP 104/70 | HR 70 | Resp 18 | Ht 70.25 in | Wt 210.2 lb

## 2024-02-16 DIAGNOSIS — M542 Cervicalgia: Secondary | ICD-10-CM | POA: Diagnosis not present

## 2024-02-16 DIAGNOSIS — E0500 Thyrotoxicosis with diffuse goiter without thyrotoxic crisis or storm: Secondary | ICD-10-CM | POA: Diagnosis not present

## 2024-02-16 NOTE — Progress Notes (Signed)
 Outpatient Endocrinology Note Jeremy Rijos, MD   Patient's Name: Jeremy [name]    DOB: 19-Apr-1980    MRN: [id number]  REASON OF VISIT: Follow-up for hyperthyroidism / Graves' disease  REFERRING PROVIDER: Margaret Eduard SAUNDERS, MD  PCP: Emilio Joesph DEL, PA-C  HISTORY OF PRESENT ILLNESS:   Jeremy [name] is a 43 y.o. old male with past medical history as listed below is presented for follow-up for hyperthyroidism / Graves' disease.  Patient presented with complaints of neck discomfort and occasional lightheadedness.  Pertinent Thyroid  History: Patient has hyperthyroidism due to Graves' disease, on methimazole .  Background: Patient was referred to endocrinology for further evaluation and management of newly diagnosed Graves' disease, initial consult was in May 2025.  Background : patient had symptoms of off-balance, lightheadedness, starting in March 2025, had ER visits had MRI and CT scan without contrast with no acute findings, later evaluated by neurology and had thyroid  function test on June 16, 2023 showed suppressed TSH with elevated free T4 3.12.  Patient has seen primary care provider on April 25 and had thyroid  function test in outside facility [facility] reviewed lab results from care everywhere showed suppressed TSH with elevated free T4 of 2.27 and elevated free T3 of 9.6 consistent with hyperthyroidism.  Patient had symptoms of weight loss, palpitation, heat intolerance, increased sweating and hand tremor.  Family history of hyperthyroidism/Graves' disease in mother.  -In May 2025 : Had elevated TSI and TRAb consistent with Graves' disease causing hyperthyroidism.   Latest Reference Range & Units 07/11/23 11:47  TSI <140 % baseline 231 (H)  TRAB <=2.00 IU/L 8.34 (H)  (H): Data is abnormally high  -Patient was started on methimazole  15 mg daily in the beginning of May 2025.  - June 2025 methimazole  was decreased to 10 mg daily, then decreased to 5 mg daily in September  2025.  And was decreased to 2.5 mg daily in November 2025.  Interval history: Patient was seen in the beginning of last month.  Methimazole  was decreased to 2.5 mg daily in the last visit.  He reports he was feeling better until last week.  When he was at the work he felt neck discomfort and occasional imbalance and lightheadedness.  He reports he was sent home from the work.  He presented today for the evaluation of thyroid  problem.  He has occasional neck discomfort, denies dysphagia and shortness of breath.  Denies palpitation.  No change in bowel habits.  He has been taking methimazole  2.5 mg daily.  No recent febrile illness.  No stomach/GI issues.  He also complains of occasional lightheadedness.  His blood pressure is in the lower normal range.  He has not been taking antihypertensive medication.  With the review of the chart historically he has low normal blood pressure in the past as well.  Heart rate is normal today 70 beats per minute.  He is accompanied by wife in the clinic today.  REVIEW OF SYSTEMS:  As per history of present illness.   PAST MEDICAL HISTORY: Past Medical History:  Diagnosis Date   Allergy    Anxiety    GERD (gastroesophageal reflux disease)    Vertigo     PAST SURGICAL HISTORY: Past Surgical History:  Procedure Laterality Date   EYE SURGERY     FRACTURE SURGERY     NECK SURGERY     TONSILLECTOMY      ALLERGIES: No Known Allergies  FAMILY HISTORY:  Family History  Problem Relation Age of Onset  Thyroid  disease Mother    Hyperlipidemia Mother    Stroke Father    Stroke Maternal Grandmother     SOCIAL HISTORY: Social History   Socioeconomic History   Marital status: Media Planner    Spouse name: Not on file   Number of children: Not on file   Years of education: Not on file   Highest education level: Not on file  Occupational History   Not on file  Tobacco Use   Smoking status: Never   Smokeless tobacco: Never  Vaping Use    Vaping status: Never Used  Substance and Sexual Activity   Alcohol use: Yes    Alcohol/week: 1.0 - 2.0 standard drink of alcohol    Types: 1 - 2 Standard drinks or equivalent per week   Drug use: No   Sexual activity: Yes  Other Topics Concern   Not on file  Social History Narrative   Not on file   Social Drivers of Health   Financial Resource Strain: High Risk (03/25/2023)   Received from [facility]   Overall Financial Resource Strain (CARDIA)    Difficulty of Paying Living Expenses: Hard  Food Insecurity: Food Insecurity Present (03/25/2023)   Received from [facility]   Hunger Vital Sign    Within the past 12 months, you worried that your food would run out before you got the money to buy more.: Sometimes true    Within the past 12 months, the food you bought just didn't last and you didn't have money to get more.: Sometimes true  Transportation Needs: Unmet Transportation Needs (03/25/2023)   Received from [facility] - Transportation    Lack of Transportation (Medical): Yes    Lack of Transportation (Non-Medical): Yes  Physical Activity: Sufficiently Active (03/25/2023)   Received from [facility]   Exercise Vital Sign    On average, how many days per week do you engage in moderate to strenuous exercise (like a brisk walk)?: 3 days    On average, how many minutes do you engage in exercise at this level?: 60 min  Stress: Stress Concern Present (03/25/2023)   Received from [facility] - Occupational Stress Questionnaire    Feeling of Stress : Rather much  Social Connections: Somewhat Isolated (03/25/2023)   Received from [facility]   Social Network    How would you rate your social network (family, work, friends)?: Restricted participation with some degree of social isolation    MEDICATIONS:  Current Outpatient Medications  Medication Sig Dispense Refill   cyclobenzaprine  (FLEXERIL ) 5 MG tablet Take 1 tablet  (5 mg total) by mouth 3 (three) times daily as needed for muscle spasms. 30 tablet 0   hydrocortisone 2.5 % cream Apply topically 2 (two) times daily.     methimazole  (TAPAZOLE ) 5 MG tablet Take 0.5 tablets (2.5 mg total) by mouth daily. 45 tablet 3   mupirocin ointment (BACTROBAN) 2 % Apply topically 2 (two) times daily.     pantoprazole  (PROTONIX ) 40 MG tablet Take 40 mg by mouth daily.     No current facility-administered medications for this visit.    PHYSICAL EXAM: Vitals:   02/16/24 1425  BP: 104/70  Pulse: 70  Resp: 18  SpO2: 97%  Weight: 210 lb 3.2 oz (95.3 kg)  Height: 5' 10.25 (1.784 m)    Body mass index is 29.95 kg/m.  Wt Readings from Last 3 Encounters:  02/16/24 210 lb 3.2 oz (  95.3 kg)  01/21/24 213 lb 3.2 oz (96.7 kg)  12/03/23 221 lb 3.2 oz (100.3 kg)    General: Well developed, well nourished male in no apparent distress.  HEENT: AT/[place], no external lesions. Hearing intact to the spoken word Eyes: EOMI. No stare, proptosis or lid lag. Conjunctiva clear and no icterus. No erythema or watering Neck: Trachea midline, neck supple with mild appreciable thyromegaly or no lymphadenopathy and no palpable thyroid  nodules.  Nontender. Lungs: Clear to auscultation, no wheeze. Respirations not labored Heart: S1S2, Regular in rate and rhythm. No loud murmurs Abdomen: Soft, non tender, non distended Neurologic: Alert, oriented, normal speech, deep tendon biceps reflexes 2+,  no gross focal neurological deficit Extremities: No pedal pitting edema, no tremors of outstretched hands Skin: Warm, color good.  Psychiatric: Does not appear depressed or anxious  PERTINENT HISTORIC LABORATORY AND IMAGING STUDIES:  All pertinent laboratory results were reviewed. Please see HPI also for further details.   TSH  Date Value Ref Range Status  01/16/2024 4.90 (H) 0.40 - 4.50 mIU/L Final  11/28/2023 16.29 (H) 0.40 - 4.50 mIU/L Final  06/16/2023 <0.005 (L) 0.450 - 4.500 uIU/mL Final     Lab Results  Component Value Date   FREET4 0.8 01/16/2024   FREET4 0.6 (L) 11/28/2023   FREET4 0.9 08/19/2023   T3FREE 2.9 01/16/2024   T3FREE 2.8 11/28/2023   T3FREE 2.9 08/19/2023   TSH 4.90 (H) 01/16/2024   TSH 16.29 (H) 11/28/2023   TSH <0.005 (L) 06/16/2023    No results found for: THYROTRECAB  Lab Results  Component Value Date   TSH 4.90 (H) 01/16/2024   TSH 16.29 (H) 11/28/2023   TSH <0.005 (L) 06/16/2023   FREET4 0.8 01/16/2024   FREET4 0.6 (L) 11/28/2023   FREET4 0.9 08/19/2023     Lab Results  Component Value Date   TSI 231 (H) 07/11/2023     No components found for: TRAB       07/04/2023 07/04/2023 07/04/2023        T3, Free 9.6 High    -- --  TSH -- <0.005 Low    --  T4, Free (Direct) -- -- 2.27 High        ASSESSMENT / PLAN  1. Graves disease   2. Neck discomfort     -Patient has hyperthyroidism secondary to Graves' disease has positive TRAb and TSI.  He has been on methimazole  since May 2025, initial dose was 15 mg daily.  Patient was taking methimazole  10 mg daily and TSH of 16 in September, 2025 and dose decreased to 5 mg daily.  Dose was decreased to 2.5 mg daily in November.  He is currently taking methimazole  2.5 mg daily.  He has been requiring low-dose of methimazole .  Discussed that will stay on antithyroid medication at this time.  Potential spontaneous resolution of Graves' disease can happen over time.  Plan: -Will check thyroid  function test today.  And adjust dose of methimazole . -I would also like to check TRAb to monitor thyroid  autoantibodies of Graves' disease - Patient has mild thyromegaly, likely related to Graves' disease.  Due to increasing neck discomfort I would like to check ultrasound thyroid . - Patient reports he may need letter for his work, after the test results plan today. Provided, lab results are in acceptable range, there will be no contraindication for his work from thyroid  perspective.  Will provide  letter after the test results if needed. - In regard to lightheadedness and imbalance, provide thyroid   function test are in the acceptable range unlikely to be related to thyroid  disorder.  Advised to discuss with primary care provider.  He has appointment with primary care provider on December 11.  Patty was seen today for graves' disease.  Diagnoses and all orders for this visit:  Graves disease -     T4, free -     T3, free -     TSH -     TRAb (TSH Receptor Binding Antibody) -     US  THYROID ; Future  Neck discomfort -     US  THYROID ; Future    DISPOSITION Follow up in clinic in 2 months suggested.  Labs today and prior to follow-up visit.  All questions answered and patient verbalized understanding of the plan.  Matai Carpenito, MD [facility] Endocrinology [facility] [address] [place], [address] [place], [place] [address] Phone # [phone]   At least part of this note was generated using voice recognition software. Inadvertent word errors may have occurred, which were not recognized during the proofreading process.

## 2024-02-16 NOTE — Telephone Encounter (Signed)
 I had decreased the dose of thyroid  medication methimazole  in last visit, lets plan for lab, please arrange for lab visit.  I have placed orders.  If you are still having discomfort in the neck, we need to plan for ultrasound of thyroid /neck, please let me know.   Based on these results will plan if he needs office visit.  In regard to imbalance it is less likely to be related to her thyroid  disorder.  I would also recommend to discuss with her primary care provider.  Dhruv Christina, MD [facility] Endocrinology [facility] [address] [place], [address] [place], [place] [address] Phone # [phone]

## 2024-02-17 ENCOUNTER — Ambulatory Visit: Payer: Self-pay | Admitting: Endocrinology

## 2024-02-17 NOTE — Telephone Encounter (Signed)
 Patient was seen in clinic visit on December 8.

## 2024-02-18 LAB — T4, FREE: Free T4: 1.1 ng/dL (ref 0.8–1.8)

## 2024-02-18 LAB — T3, FREE: T3, Free: 3.3 pg/mL (ref 2.3–4.2)

## 2024-02-18 LAB — TSH: TSH: 4.5 m[IU]/L (ref 0.40–4.50)

## 2024-02-18 LAB — TRAB (TSH RECEPTOR BINDING ANTIBODY): TRAB: 2.6 IU/L — ABNORMAL HIGH (ref <=2.00)

## 2024-02-19 ENCOUNTER — Inpatient Hospital Stay: Admission: RE | Admit: 2024-02-19 | Discharge: 2024-02-19 | Attending: Endocrinology

## 2024-02-19 DIAGNOSIS — R42 Dizziness and giddiness: Secondary | ICD-10-CM | POA: Diagnosis not present

## 2024-02-19 DIAGNOSIS — E0500 Thyrotoxicosis with diffuse goiter without thyrotoxic crisis or storm: Secondary | ICD-10-CM

## 2024-02-19 DIAGNOSIS — R2689 Other abnormalities of gait and mobility: Secondary | ICD-10-CM | POA: Diagnosis not present

## 2024-02-19 DIAGNOSIS — F419 Anxiety disorder, unspecified: Secondary | ICD-10-CM | POA: Diagnosis not present

## 2024-02-19 DIAGNOSIS — M542 Cervicalgia: Secondary | ICD-10-CM | POA: Diagnosis not present

## 2024-03-29 ENCOUNTER — Ambulatory Visit: Admitting: Diagnostic Neuroimaging

## 2024-03-29 ENCOUNTER — Telehealth: Payer: Self-pay | Admitting: Diagnostic Neuroimaging

## 2024-03-29 NOTE — Telephone Encounter (Signed)
 Request to reschedule appointment, due to a conflict

## 2024-04-19 ENCOUNTER — Other Ambulatory Visit

## 2024-04-22 ENCOUNTER — Ambulatory Visit: Admitting: Endocrinology

## 2024-05-24 ENCOUNTER — Ambulatory Visit: Admitting: Diagnostic Neuroimaging
